# Patient Record
Sex: Female | Born: 1940 | Race: White | Hispanic: No | Marital: Single | Smoking: Never smoker
Health system: Southern US, Community
[De-identification: ages and names within clinical notes are randomized; demographics above are authoritative.]

## PROBLEM LIST (undated history)

## (undated) DIAGNOSIS — K219 Gastro-esophageal reflux disease without esophagitis: Secondary | ICD-10-CM

## (undated) DIAGNOSIS — M1990 Unspecified osteoarthritis, unspecified site: Secondary | ICD-10-CM

## (undated) DIAGNOSIS — R0602 Shortness of breath: Secondary | ICD-10-CM

## (undated) DIAGNOSIS — I10 Essential (primary) hypertension: Secondary | ICD-10-CM

## (undated) DIAGNOSIS — C50919 Malignant neoplasm of unspecified site of unspecified female breast: Secondary | ICD-10-CM

## (undated) HISTORY — PX: ABDOMINAL HYSTERECTOMY: SHX81

## (undated) HISTORY — PX: BREAST SURGERY: SHX581

## (undated) HISTORY — DX: Unspecified osteoarthritis, unspecified site: M19.90

## (undated) HISTORY — DX: Malignant neoplasm of unspecified site of unspecified female breast: C50.919

## (undated) HISTORY — PX: MOLE REMOVAL: SHX2046

## (undated) HISTORY — DX: Shortness of breath: R06.02

## (undated) HISTORY — DX: Essential (primary) hypertension: I10

## (undated) HISTORY — DX: Gastro-esophageal reflux disease without esophagitis: K21.9

---

## 1997-12-20 ENCOUNTER — Emergency Department (HOSPITAL_COMMUNITY): Admission: EM | Admit: 1997-12-20 | Discharge: 1997-12-20 | Payer: Self-pay | Admitting: *Deleted

## 1999-02-14 ENCOUNTER — Encounter: Admission: RE | Admit: 1999-02-14 | Discharge: 1999-05-15 | Payer: Self-pay | Admitting: Orthopedic Surgery

## 2002-08-06 HISTORY — PX: BREAST LUMPECTOMY: SHX2

## 2002-10-05 ENCOUNTER — Encounter (INDEPENDENT_AMBULATORY_CARE_PROVIDER_SITE_OTHER): Payer: Self-pay | Admitting: Specialist

## 2002-10-05 ENCOUNTER — Other Ambulatory Visit: Admission: RE | Admit: 2002-10-05 | Discharge: 2002-10-05 | Payer: Self-pay | Admitting: Radiology

## 2002-10-05 ENCOUNTER — Encounter: Payer: Self-pay | Admitting: Surgery

## 2002-10-05 ENCOUNTER — Encounter: Admission: RE | Admit: 2002-10-05 | Discharge: 2002-10-05 | Payer: Self-pay | Admitting: Surgery

## 2002-10-05 DIAGNOSIS — C50919 Malignant neoplasm of unspecified site of unspecified female breast: Secondary | ICD-10-CM

## 2002-10-05 HISTORY — DX: Malignant neoplasm of unspecified site of unspecified female breast: C50.919

## 2002-10-12 ENCOUNTER — Encounter: Admission: RE | Admit: 2002-10-12 | Discharge: 2002-10-12 | Payer: Self-pay | Admitting: Surgery

## 2002-10-12 ENCOUNTER — Encounter: Payer: Self-pay | Admitting: Surgery

## 2002-10-13 ENCOUNTER — Encounter: Payer: Self-pay | Admitting: Surgery

## 2002-10-13 ENCOUNTER — Ambulatory Visit (HOSPITAL_COMMUNITY): Admission: RE | Admit: 2002-10-13 | Discharge: 2002-10-13 | Payer: Self-pay | Admitting: Surgery

## 2002-10-14 ENCOUNTER — Encounter: Payer: Self-pay | Admitting: Surgery

## 2002-10-15 ENCOUNTER — Encounter: Admission: RE | Admit: 2002-10-15 | Discharge: 2002-10-15 | Payer: Self-pay | Admitting: Surgery

## 2002-10-15 ENCOUNTER — Encounter: Payer: Self-pay | Admitting: Surgery

## 2002-10-15 ENCOUNTER — Encounter (INDEPENDENT_AMBULATORY_CARE_PROVIDER_SITE_OTHER): Payer: Self-pay | Admitting: Specialist

## 2002-10-15 ENCOUNTER — Ambulatory Visit (HOSPITAL_BASED_OUTPATIENT_CLINIC_OR_DEPARTMENT_OTHER): Admission: RE | Admit: 2002-10-15 | Discharge: 2002-10-15 | Payer: Self-pay | Admitting: Surgery

## 2002-10-26 ENCOUNTER — Ambulatory Visit: Admission: RE | Admit: 2002-10-26 | Discharge: 2002-11-20 | Payer: Self-pay | Admitting: *Deleted

## 2002-11-09 ENCOUNTER — Encounter: Payer: Self-pay | Admitting: Oncology

## 2002-11-09 ENCOUNTER — Ambulatory Visit (HOSPITAL_COMMUNITY): Admission: RE | Admit: 2002-11-09 | Discharge: 2002-11-09 | Payer: Self-pay | Admitting: Oncology

## 2002-11-12 ENCOUNTER — Encounter: Payer: Self-pay | Admitting: Oncology

## 2002-11-12 ENCOUNTER — Ambulatory Visit (HOSPITAL_COMMUNITY): Admission: RE | Admit: 2002-11-12 | Discharge: 2002-11-12 | Payer: Self-pay | Admitting: Oncology

## 2002-11-16 ENCOUNTER — Ambulatory Visit (HOSPITAL_COMMUNITY): Admission: RE | Admit: 2002-11-16 | Discharge: 2002-11-16 | Payer: Self-pay | Admitting: Oncology

## 2002-11-16 ENCOUNTER — Encounter: Payer: Self-pay | Admitting: Oncology

## 2003-02-09 ENCOUNTER — Ambulatory Visit: Admission: RE | Admit: 2003-02-09 | Discharge: 2003-04-14 | Payer: Self-pay | Admitting: *Deleted

## 2003-06-02 ENCOUNTER — Ambulatory Visit (HOSPITAL_COMMUNITY): Admission: RE | Admit: 2003-06-02 | Discharge: 2003-06-02 | Payer: Self-pay | Admitting: Oncology

## 2004-08-18 ENCOUNTER — Ambulatory Visit: Payer: Self-pay | Admitting: Oncology

## 2004-12-06 ENCOUNTER — Ambulatory Visit: Payer: Self-pay | Admitting: Oncology

## 2005-01-12 ENCOUNTER — Ambulatory Visit (HOSPITAL_COMMUNITY): Admission: RE | Admit: 2005-01-12 | Discharge: 2005-01-12 | Payer: Self-pay | Admitting: Oncology

## 2005-05-31 ENCOUNTER — Ambulatory Visit: Payer: Self-pay | Admitting: Oncology

## 2005-06-01 ENCOUNTER — Ambulatory Visit (HOSPITAL_COMMUNITY): Admission: RE | Admit: 2005-06-01 | Discharge: 2005-06-01 | Payer: Self-pay | Admitting: Oncology

## 2005-10-29 ENCOUNTER — Ambulatory Visit: Payer: Self-pay | Admitting: Oncology

## 2006-01-18 ENCOUNTER — Encounter: Admission: RE | Admit: 2006-01-18 | Discharge: 2006-01-18 | Payer: Self-pay | Admitting: Surgery

## 2006-04-25 ENCOUNTER — Ambulatory Visit: Payer: Self-pay | Admitting: Oncology

## 2006-04-29 LAB — CBC WITH DIFFERENTIAL/PLATELET
Eosinophils Absolute: 0.1 10*3/uL (ref 0.0–0.5)
HCT: 39.5 % (ref 34.8–46.6)
LYMPH%: 35.7 % (ref 14.0–48.0)
MONO#: 0.4 10*3/uL (ref 0.1–0.9)
NEUT#: 3 10*3/uL (ref 1.5–6.5)
NEUT%: 55.6 % (ref 39.6–76.8)
Platelets: 223 10*3/uL (ref 145–400)
RBC: 4.31 10*6/uL (ref 3.70–5.32)
WBC: 5.4 10*3/uL (ref 3.9–10.0)
lymph#: 1.9 10*3/uL (ref 0.9–3.3)

## 2006-04-29 LAB — COMPREHENSIVE METABOLIC PANEL
ALT: 13 U/L (ref 0–40)
Alkaline Phosphatase: 58 U/L (ref 39–117)
Glucose, Bld: 152 mg/dL — ABNORMAL HIGH (ref 70–99)
Sodium: 139 mEq/L (ref 135–145)
Total Bilirubin: 0.6 mg/dL (ref 0.3–1.2)
Total Protein: 6.4 g/dL (ref 6.0–8.3)

## 2006-04-29 LAB — CANCER ANTIGEN 27.29: CA 27.29: 7 U/mL (ref 0–39)

## 2006-10-23 ENCOUNTER — Ambulatory Visit: Payer: Self-pay | Admitting: Oncology

## 2006-10-28 LAB — CBC WITH DIFFERENTIAL/PLATELET
BASO%: 0.4 % (ref 0.0–2.0)
Basophils Absolute: 0 10*3/uL (ref 0.0–0.1)
Eosinophils Absolute: 0.1 10*3/uL (ref 0.0–0.5)
HCT: 41 % (ref 34.8–46.6)
HGB: 14.6 g/dL (ref 11.6–15.9)
LYMPH%: 32.4 % (ref 14.0–48.0)
MCHC: 35.6 g/dL (ref 32.0–36.0)
MONO#: 0.4 10*3/uL (ref 0.1–0.9)
NEUT%: 57.5 % (ref 39.6–76.8)
Platelets: 228 10*3/uL (ref 145–400)
WBC: 5.8 10*3/uL (ref 3.9–10.0)

## 2006-10-28 LAB — COMPREHENSIVE METABOLIC PANEL
BUN: 13 mg/dL (ref 6–23)
CO2: 30 mEq/L (ref 19–32)
Calcium: 9.4 mg/dL (ref 8.4–10.5)
Creatinine, Ser: 0.9 mg/dL (ref 0.40–1.20)
Glucose, Bld: 115 mg/dL — ABNORMAL HIGH (ref 70–99)
Total Bilirubin: 0.7 mg/dL (ref 0.3–1.2)

## 2006-10-28 LAB — LACTATE DEHYDROGENASE: LDH: 180 U/L (ref 94–250)

## 2006-10-28 LAB — CANCER ANTIGEN 27.29: CA 27.29: 11 U/mL (ref 0–39)

## 2006-12-06 ENCOUNTER — Ambulatory Visit (HOSPITAL_COMMUNITY): Admission: RE | Admit: 2006-12-06 | Discharge: 2006-12-06 | Payer: Self-pay | Admitting: Oncology

## 2007-05-21 ENCOUNTER — Ambulatory Visit: Payer: Self-pay | Admitting: Oncology

## 2007-05-23 LAB — LACTATE DEHYDROGENASE: LDH: 189 U/L (ref 94–250)

## 2007-05-23 LAB — CBC WITH DIFFERENTIAL/PLATELET
Basophils Absolute: 0 10*3/uL (ref 0.0–0.1)
EOS%: 1.5 % (ref 0.0–7.0)
Eosinophils Absolute: 0.1 10*3/uL (ref 0.0–0.5)
LYMPH%: 30.6 % (ref 14.0–48.0)
MCH: 32 pg (ref 26.0–34.0)
MCV: 91 fL (ref 81.0–101.0)
MONO%: 7.3 % (ref 0.0–13.0)
NEUT#: 4.5 10*3/uL (ref 1.5–6.5)
Platelets: 236 10*3/uL (ref 145–400)
RBC: 4.4 10*6/uL (ref 3.70–5.32)
RDW: 13.6 % (ref 11.3–14.5)

## 2007-05-23 LAB — COMPREHENSIVE METABOLIC PANEL
AST: 18 U/L (ref 0–37)
Alkaline Phosphatase: 63 U/L (ref 39–117)
BUN: 14 mg/dL (ref 6–23)
Glucose, Bld: 123 mg/dL — ABNORMAL HIGH (ref 70–99)
Sodium: 143 mEq/L (ref 135–145)
Total Bilirubin: 0.5 mg/dL (ref 0.3–1.2)

## 2007-06-06 ENCOUNTER — Ambulatory Visit (HOSPITAL_COMMUNITY): Admission: RE | Admit: 2007-06-06 | Discharge: 2007-06-06 | Payer: Self-pay | Admitting: Oncology

## 2007-08-07 HISTORY — PX: BREAST LUMPECTOMY: SHX2

## 2007-11-19 ENCOUNTER — Ambulatory Visit: Payer: Self-pay | Admitting: Oncology

## 2007-11-21 LAB — CBC WITH DIFFERENTIAL/PLATELET
BASO%: 0.4 % (ref 0.0–2.0)
LYMPH%: 28.3 % (ref 14.0–48.0)
MCHC: 34.6 g/dL (ref 32.0–36.0)
MONO#: 0.5 10*3/uL (ref 0.1–0.9)
Platelets: 216 10*3/uL (ref 145–400)
RBC: 4.66 10*6/uL (ref 3.70–5.32)
RDW: 13.2 % (ref 11.3–14.5)
WBC: 7.8 10*3/uL (ref 3.9–10.0)
lymph#: 2.2 10*3/uL (ref 0.9–3.3)

## 2007-11-24 LAB — COMPREHENSIVE METABOLIC PANEL
ALT: 19 U/L (ref 0–35)
CO2: 28 mEq/L (ref 19–32)
Sodium: 142 mEq/L (ref 135–145)
Total Bilirubin: 0.5 mg/dL (ref 0.3–1.2)
Total Protein: 7.2 g/dL (ref 6.0–8.3)

## 2007-11-24 LAB — LACTATE DEHYDROGENASE: LDH: 211 U/L (ref 94–250)

## 2007-11-28 LAB — VITAMIN D 1,25 DIHYDROXY: Vit D, 1,25-Dihydroxy: 46 pg/mL (ref 15–75)

## 2008-04-28 ENCOUNTER — Ambulatory Visit: Payer: Self-pay | Admitting: Oncology

## 2008-04-30 LAB — CBC WITH DIFFERENTIAL/PLATELET
Basophils Absolute: 0.1 10*3/uL (ref 0.0–0.1)
Eosinophils Absolute: 0.1 10*3/uL (ref 0.0–0.5)
HGB: 14.6 g/dL (ref 11.6–15.9)
MCV: 88.9 fL (ref 81.0–101.0)
MONO#: 0.5 10*3/uL (ref 0.1–0.9)
NEUT#: 3.8 10*3/uL (ref 1.5–6.5)
RDW: 12.5 % (ref 11.3–14.5)
WBC: 6.5 10*3/uL (ref 3.9–10.0)
lymph#: 2.1 10*3/uL (ref 0.9–3.3)

## 2008-05-03 LAB — COMPREHENSIVE METABOLIC PANEL
Albumin: 4.2 g/dL (ref 3.5–5.2)
BUN: 14 mg/dL (ref 6–23)
CO2: 26 mEq/L (ref 19–32)
Calcium: 9 mg/dL (ref 8.4–10.5)
Chloride: 103 mEq/L (ref 96–112)
Glucose, Bld: 102 mg/dL — ABNORMAL HIGH (ref 70–99)
Potassium: 3.7 mEq/L (ref 3.5–5.3)
Total Protein: 7 g/dL (ref 6.0–8.3)

## 2008-05-03 LAB — LIPID PANEL
HDL: 53 mg/dL (ref >39)
LDL Cholesterol: 115 mg/dL — ABNORMAL HIGH (ref 0–99)
Total CHOL/HDL Ratio: 3.9 Ratio
VLDL: 39 mg/dL (ref 0–40)

## 2008-07-15 ENCOUNTER — Encounter (INDEPENDENT_AMBULATORY_CARE_PROVIDER_SITE_OTHER): Payer: Self-pay | Admitting: Surgery

## 2008-07-15 ENCOUNTER — Ambulatory Visit (HOSPITAL_COMMUNITY): Admission: RE | Admit: 2008-07-15 | Discharge: 2008-07-15 | Payer: Self-pay | Admitting: Surgery

## 2008-07-27 ENCOUNTER — Ambulatory Visit: Payer: Self-pay | Admitting: Oncology

## 2009-01-05 ENCOUNTER — Ambulatory Visit: Payer: Self-pay | Admitting: Oncology

## 2009-01-07 LAB — CBC WITH DIFFERENTIAL/PLATELET
BASO%: 0.3 % (ref 0.0–2.0)
Basophils Absolute: 0 10*3/uL (ref 0.0–0.1)
EOS%: 1.9 % (ref 0.0–7.0)
HGB: 14.3 g/dL (ref 11.6–15.9)
MCH: 30.9 pg (ref 25.1–34.0)
MCV: 90.2 fL (ref 79.5–101.0)
MONO%: 7.8 % (ref 0.0–14.0)
RBC: 4.62 10*6/uL (ref 3.70–5.45)
RDW: 13 % (ref 11.2–14.5)
lymph#: 2 10*3/uL (ref 0.9–3.3)

## 2009-01-10 LAB — COMPREHENSIVE METABOLIC PANEL
ALT: 16 U/L (ref 0–35)
AST: 19 U/L (ref 0–37)
Albumin: 3.9 g/dL (ref 3.5–5.2)
Alkaline Phosphatase: 52 U/L (ref 39–117)
BUN: 8 mg/dL (ref 6–23)
Calcium: 8.6 mg/dL (ref 8.4–10.5)
Chloride: 105 mEq/L (ref 96–112)
Potassium: 4 mEq/L (ref 3.5–5.3)
Sodium: 144 mEq/L (ref 135–145)
Total Protein: 6.6 g/dL (ref 6.0–8.3)

## 2009-01-10 LAB — LIPID PANEL
LDL Cholesterol: 129 mg/dL — ABNORMAL HIGH (ref 0–99)
Triglycerides: 146 mg/dL (ref <150)

## 2009-02-04 ENCOUNTER — Ambulatory Visit (HOSPITAL_COMMUNITY): Admission: RE | Admit: 2009-02-04 | Discharge: 2009-02-04 | Payer: Self-pay | Admitting: Oncology

## 2009-08-02 ENCOUNTER — Ambulatory Visit: Payer: Self-pay | Admitting: Oncology

## 2009-08-04 LAB — CBC WITH DIFFERENTIAL/PLATELET
Basophils Absolute: 0 10*3/uL (ref 0.0–0.1)
EOS%: 2.3 % (ref 0.0–7.0)
Eosinophils Absolute: 0.2 10*3/uL (ref 0.0–0.5)
HGB: 15.2 g/dL (ref 11.6–15.9)
MCH: 31.6 pg (ref 25.1–34.0)
MCV: 92.4 fL (ref 79.5–101.0)
MONO%: 8.1 % (ref 0.0–14.0)
NEUT#: 4.1 10*3/uL (ref 1.5–6.5)
RBC: 4.8 10*6/uL (ref 3.70–5.45)
RDW: 13.8 % (ref 11.2–14.5)
lymph#: 1.8 10*3/uL (ref 0.9–3.3)

## 2009-08-04 LAB — COMPREHENSIVE METABOLIC PANEL
AST: 35 U/L (ref 0–37)
Albumin: 4 g/dL (ref 3.5–5.2)
Alkaline Phosphatase: 64 U/L (ref 39–117)
BUN: 10 mg/dL (ref 6–23)
Calcium: 9.3 mg/dL (ref 8.4–10.5)
Chloride: 104 mEq/L (ref 96–112)
Potassium: 3.9 mEq/L (ref 3.5–5.3)
Sodium: 143 mEq/L (ref 135–145)
Total Protein: 6.7 g/dL (ref 6.0–8.3)

## 2009-08-04 LAB — CANCER ANTIGEN 27.29: CA 27.29: 10 U/mL (ref 0–39)

## 2009-08-04 LAB — VITAMIN D 25 HYDROXY (VIT D DEFICIENCY, FRACTURES): Vit D, 25-Hydroxy: 42 ng/mL (ref 30–89)

## 2010-02-01 ENCOUNTER — Ambulatory Visit: Payer: Self-pay | Admitting: Oncology

## 2010-02-03 LAB — CBC WITH DIFFERENTIAL/PLATELET
BASO%: 0.5 % (ref 0.0–2.0)
Basophils Absolute: 0 10*3/uL (ref 0.0–0.1)
EOS%: 2.2 % (ref 0.0–7.0)
Eosinophils Absolute: 0.1 10*3/uL (ref 0.0–0.5)
HCT: 43.1 % (ref 34.8–46.6)
HGB: 14.8 g/dL (ref 11.6–15.9)
LYMPH%: 34 % (ref 14.0–49.7)
MCH: 31.2 pg (ref 25.1–34.0)
MCHC: 34.3 g/dL (ref 31.5–36.0)
MCV: 90.8 fL (ref 79.5–101.0)
MONO#: 0.4 10*3/uL (ref 0.1–0.9)
MONO%: 7.6 % (ref 0.0–14.0)
NEUT#: 3.3 10*3/uL (ref 1.5–6.5)
NEUT%: 55.7 % (ref 38.4–76.8)
Platelets: 176 10*3/uL (ref 145–400)
RBC: 4.75 10*6/uL (ref 3.70–5.45)
RDW: 13.4 % (ref 11.2–14.5)
WBC: 5.9 10*3/uL (ref 3.9–10.3)
lymph#: 2 10*3/uL (ref 0.9–3.3)

## 2010-02-03 LAB — LIPID PANEL
Cholesterol: 204 mg/dL — ABNORMAL HIGH (ref 0–200)
HDL: 53 mg/dL (ref >39)
LDL Cholesterol: 116 mg/dL — ABNORMAL HIGH (ref 0–99)
Total CHOL/HDL Ratio: 3.8 Ratio
Triglycerides: 175 mg/dL — ABNORMAL HIGH (ref <150)
VLDL: 35 mg/dL (ref 0–40)

## 2010-02-03 LAB — COMPREHENSIVE METABOLIC PANEL
ALT: 10 U/L (ref 0–35)
AST: 17 U/L (ref 0–37)
Albumin: 4 g/dL (ref 3.5–5.2)
Alkaline Phosphatase: 50 U/L (ref 39–117)
BUN: 13 mg/dL (ref 6–23)
CO2: 27 mEq/L (ref 19–32)
Calcium: 9.2 mg/dL (ref 8.4–10.5)
Chloride: 103 mEq/L (ref 96–112)
Creatinine, Ser: 0.93 mg/dL (ref 0.40–1.20)
Glucose, Bld: 95 mg/dL (ref 70–99)
Potassium: 3.9 mEq/L (ref 3.5–5.3)
Sodium: 141 mEq/L (ref 135–145)
Total Bilirubin: 0.8 mg/dL (ref 0.3–1.2)
Total Protein: 6.6 g/dL (ref 6.0–8.3)

## 2010-02-03 LAB — LACTATE DEHYDROGENASE: LDH: 187 U/L (ref 94–250)

## 2010-02-03 LAB — VITAMIN D 25 HYDROXY (VIT D DEFICIENCY, FRACTURES): Vit D, 25-Hydroxy: 56 ng/mL (ref 30–89)

## 2010-02-03 LAB — CANCER ANTIGEN 27.29: CA 27.29: 4 U/mL (ref 0–39)

## 2010-07-27 ENCOUNTER — Ambulatory Visit (HOSPITAL_BASED_OUTPATIENT_CLINIC_OR_DEPARTMENT_OTHER): Payer: Commercial Managed Care - PPO | Admitting: Oncology

## 2010-07-28 LAB — CBC WITH DIFFERENTIAL/PLATELET
BASO%: 0.3 % (ref 0.0–2.0)
Basophils Absolute: 0 10*3/uL (ref 0.0–0.1)
EOS%: 1.5 % (ref 0.0–7.0)
Eosinophils Absolute: 0.1 10*3/uL (ref 0.0–0.5)
HCT: 42.7 % (ref 34.8–46.6)
HGB: 14.5 g/dL (ref 11.6–15.9)
LYMPH%: 30.7 % (ref 14.0–49.7)
MCH: 30.9 pg (ref 25.1–34.0)
MCHC: 34.1 g/dL (ref 31.5–36.0)
MCV: 90.8 fL (ref 79.5–101.0)
MONO#: 0.6 10*3/uL (ref 0.1–0.9)
MONO%: 8.7 % (ref 0.0–14.0)
NEUT#: 4.3 10*3/uL (ref 1.5–6.5)
NEUT%: 58.8 % (ref 38.4–76.8)
Platelets: 205 10*3/uL (ref 145–400)
RBC: 4.7 10*6/uL (ref 3.70–5.45)
RDW: 13.3 % (ref 11.2–14.5)
WBC: 7.3 10*3/uL (ref 3.9–10.3)
lymph#: 2.2 10*3/uL (ref 0.9–3.3)

## 2010-07-29 LAB — COMPREHENSIVE METABOLIC PANEL
ALT: 19 U/L (ref 0–35)
AST: 23 U/L (ref 0–37)
Albumin: 3.9 g/dL (ref 3.5–5.2)
Alkaline Phosphatase: 55 U/L (ref 39–117)
BUN: 16 mg/dL (ref 6–23)
CO2: 30 mEq/L (ref 19–32)
Calcium: 9.2 mg/dL (ref 8.4–10.5)
Chloride: 103 mEq/L (ref 96–112)
Creatinine, Ser: 0.88 mg/dL (ref 0.40–1.20)
Glucose, Bld: 85 mg/dL (ref 70–99)
Potassium: 4.1 mEq/L (ref 3.5–5.3)
Sodium: 140 mEq/L (ref 135–145)
Total Bilirubin: 0.4 mg/dL (ref 0.3–1.2)
Total Protein: 6.3 g/dL (ref 6.0–8.3)

## 2010-07-29 LAB — VITAMIN D 25 HYDROXY (VIT D DEFICIENCY, FRACTURES): Vit D, 25-Hydroxy: 46 ng/mL (ref 30–89)

## 2010-07-29 LAB — LACTATE DEHYDROGENASE: LDH: 170 U/L (ref 94–250)

## 2010-07-29 LAB — CANCER ANTIGEN 27.29: CA 27.29: 12 U/mL (ref 0–39)

## 2010-08-10 ENCOUNTER — Encounter (INDEPENDENT_AMBULATORY_CARE_PROVIDER_SITE_OTHER): Payer: Self-pay | Admitting: *Deleted

## 2010-08-21 DIAGNOSIS — C50219 Malignant neoplasm of upper-inner quadrant of unspecified female breast: Secondary | ICD-10-CM

## 2010-08-21 DIAGNOSIS — Z170 Estrogen receptor positive status [ER+]: Secondary | ICD-10-CM

## 2010-09-07 NOTE — Letter (Signed)
Summary: Colonoscopy Date Change Letter  [facility]  [address] [place], [place] [address]   Phone: [phone]  Fax: [phone]      August 10, 2010 MRN: [id number]   [facility] [address] [place], [place]  [address]   Dear Ms. Marcel,   Previously you were recommended to have a repeat colonoscopy around this time. Your chart was recently reviewed by Dr. Lina Sar of [facility]. Follow up colonoscopy is now recommended in December 2014. This revised recommendation is based on current, nationally recognized guidelines for colorectal cancer screening and polyp surveillance. These guidelines are endorsed by the American Cancer Society, [employer] on Colorectal Cancer as well as numerous other major medical organizations.  Please understand that our recommendation assumes that you do not have any new symptoms such as bleeding, a change in bowel habits, anemia, or significant abdominal discomfort. If you do have any concerning GI symptoms or want to discuss the guideline recommendations, please call to arrange an office visit at your earliest convenience. Otherwise we will keep you in our reminder system and contact you 1-2 months prior to the date listed above to schedule your next colonoscopy.  Thank you,  Hedwig Morton. Juanda Chance, M.D.  [facility] [phone]

## 2010-12-19 NOTE — Op Note (Signed)
NAME:  Sara Wells, Sara Wells                 ACCOUNT NO.:  [id number]   MEDICAL RECORD NO.:  [id number]          PATIENT TYPE:  AMB   LOCATION:  SDS                          FACILITY:  [facility]   PHYSICIAN:  Currie Paris, M.D.DATE OF BIRTH:  1941/01/11   DATE OF PROCEDURE:  07/15/2008  DATE OF DISCHARGE:  07/15/2008                               OPERATIVE REPORT   PREOPERATIVE DIAGNOSIS:  Palpable left breast mass.   POSTOPERATIVE DIAGNOSIS:  Palpable left breast mass.   OPERATION:  Excisional biopsy, left breast mass.   SURGEON:  Currie Paris, MD   ANESTHESIA:  General.   CLINICAL HISTORY:  This is a 70 year old lady status post right  lumpectomy in 2004 for carcinoma who has a palpable abnormality in the  left breast, medially located that was not visualized on imaging  studies.  It was followed for 3 months and at this point because of its  persistence I elected to biopsy this surgically.  It was felt this was  most likely fibrocystic and fibrotic changes mixed in with some fatty  tissue and benign.   DESCRIPTION OF PROCEDURE:  The patient was seen in the holding area and  she had no further questions.  We reviewed the plans for the procedure  as noted above and I initialed the left breast as the operative side.   The patient was taken into the operating room and prior to being given  any sedation, the exact location of the mass was identified and marked  by the patient and myself.  It was about the 9 o'clock position of the  left breast.   After satisfactory general anesthesia had been obtained, the breast was  prepped and draped and the time-out was done.   I then injected 0.25% plain Marcaine all around the area of the biopsy.  I made a transverse incision and divided the subcutaneous tissue and  took a wide excisional biopsy of the tissue underneath which appeared to  be fatty tissue with fibrotic breast tissue mixed in consistent with the  preoperative clots.   Once I had this widely out, I had to make sure that  was dry using either cautery or suture ligatures of 3-0 Vicryl.  I then  closed with 3-0 Vicryl, 4-0 Monocryl subcuticular, and Dermabond.   The patient tolerated the procedure well and there were no  complications.  All counts were correct.      Currie Paris, M.D.  Electronically Signed    CJS/MEDQ  D:  07/15/2008  T:  07/15/2008  Job:  [id number]

## 2011-01-09 ENCOUNTER — Encounter (INDEPENDENT_AMBULATORY_CARE_PROVIDER_SITE_OTHER): Payer: Self-pay | Admitting: Surgery

## 2011-02-09 ENCOUNTER — Encounter: Payer: Commercial Managed Care - PPO | Admitting: Oncology

## 2011-02-09 ENCOUNTER — Other Ambulatory Visit: Payer: Self-pay | Admitting: Oncology

## 2011-02-09 LAB — LIPID PANEL
LDL Cholesterol: 126 mg/dL — ABNORMAL HIGH (ref 0–99)
Total CHOL/HDL Ratio: 3.7 Ratio
VLDL: 22 mg/dL (ref 0–40)

## 2011-03-02 ENCOUNTER — Ambulatory Visit (INDEPENDENT_AMBULATORY_CARE_PROVIDER_SITE_OTHER): Payer: Commercial Managed Care - PPO | Admitting: Surgery

## 2011-03-02 ENCOUNTER — Encounter (INDEPENDENT_AMBULATORY_CARE_PROVIDER_SITE_OTHER): Payer: Self-pay | Admitting: Surgery

## 2011-03-02 VITALS — BP 130/82 | HR 76 | Temp 96.6°F | Ht 64.0 in

## 2011-03-02 DIAGNOSIS — Z853 Personal history of malignant neoplasm of breast: Secondary | ICD-10-CM

## 2011-03-02 NOTE — Progress Notes (Signed)
03/02/2011  Sara Wells is a 70 y.o.female who presents for routine followup of her Stage II IDC, Receptor positivediagnosed in 2004 and treated with Lumpectomy,SLN and Radiation. She has no problems or concerns on either side.  PFSH She has had no significant changes since the last visit here.  ROS There have been no significant changes since the last visit here  General: The patient is alert, oriented, generally healty appearing, NAD. Mood and affect are normal.  Breasts: The right breast remains a little tender at the lumpectomy site. However there are no abnormal areas. The remainder of the breast is normal. The left breast is completely normal to both inspection and palpation. Lymphatics: She has no axillary or supraclavicular adenopathy on either side.  Extremities: Full ROM of the surgical side with no lymphedema noted.  Data Reviewed: Last mammogram was WNL and she is due next month  Impression: Normal exam, no evidence of recurrence  Plan: Will see back on a PRN basis

## 2011-03-02 NOTE — Patient Instructions (Signed)
I will see you back on an as-needed basis. Call if you have any problems or questions or any issues with your breast.

## 2011-05-10 LAB — BASIC METABOLIC PANEL
CO2: 29 mEq/L (ref 19–32)
Chloride: 104 mEq/L (ref 96–112)
GFR calc Af Amer: >60 mL/min (ref >60)
Potassium: 3.8 mEq/L (ref 3.5–5.1)

## 2011-05-10 LAB — CBC
Platelets: 215 10*3/uL (ref 150–400)
WBC: 8.2 10*3/uL (ref 4.0–10.5)

## 2011-06-12 ENCOUNTER — Encounter: Payer: Self-pay | Admitting: *Deleted

## 2011-06-12 ENCOUNTER — Other Ambulatory Visit: Payer: Self-pay | Admitting: *Deleted

## 2011-08-01 ENCOUNTER — Telehealth: Payer: Self-pay | Admitting: *Deleted

## 2011-08-01 NOTE — Telephone Encounter (Signed)
patient confirmed over the phone the new date and time on 09-03-2011 at 4:00pm lab still on the same date and time

## 2011-08-21 ENCOUNTER — Telehealth: Payer: Self-pay | Admitting: *Deleted

## 2011-08-21 NOTE — Telephone Encounter (Signed)
patient confirmed over the phone the new date and time of the doctor's appointment

## 2011-08-22 ENCOUNTER — Telehealth: Payer: Self-pay | Admitting: *Deleted

## 2011-08-22 NOTE — Telephone Encounter (Signed)
Received phone call from patient stating that she has finished one bottle of pills but still has additional pills in her second bottle that she can take for now. Patient is expected to have enough medication to last until mid-February. Patient will have lab appointment this Friday, with medication exchange at provider visit, which has been rescheduled from 08/31/11 with Dr. Donnie Coffin to 09/04/11 with Debbora Presto PA. Patient states she does have enough medication to last until that time, but will contact research nurse if needed in the interim.

## 2011-08-24 ENCOUNTER — Other Ambulatory Visit (HOSPITAL_BASED_OUTPATIENT_CLINIC_OR_DEPARTMENT_OTHER): Payer: Medicare Other | Admitting: Lab

## 2011-08-24 DIAGNOSIS — Z170 Estrogen receptor positive status [ER+]: Secondary | ICD-10-CM

## 2011-08-24 DIAGNOSIS — C50219 Malignant neoplasm of upper-inner quadrant of unspecified female breast: Secondary | ICD-10-CM

## 2011-08-24 LAB — CBC WITH DIFFERENTIAL/PLATELET
Basophils Absolute: 0 10*3/uL (ref 0.0–0.1)
HCT: 44.7 % (ref 34.8–46.6)
HGB: 15.1 g/dL (ref 11.6–15.9)
MONO#: 0.5 10*3/uL (ref 0.1–0.9)
NEUT#: 3.8 10*3/uL (ref 1.5–6.5)
NEUT%: 59.2 % (ref 38.4–76.8)
RDW: 13.3 % (ref 11.2–14.5)
WBC: 6.4 10*3/uL (ref 3.9–10.3)
lymph#: 1.9 10*3/uL (ref 0.9–3.3)

## 2011-08-25 LAB — COMPREHENSIVE METABOLIC PANEL
ALT: 21 U/L (ref 0–35)
Albumin: 4.1 g/dL (ref 3.5–5.2)
BUN: 13 mg/dL (ref 6–23)
CO2: 26 mEq/L (ref 19–32)
Calcium: 9.2 mg/dL (ref 8.4–10.5)
Chloride: 103 mEq/L (ref 96–112)
Creatinine, Ser: 1.17 mg/dL — ABNORMAL HIGH (ref 0.50–1.10)
Potassium: 3.8 mEq/L (ref 3.5–5.3)

## 2011-08-25 LAB — VITAMIN D 25 HYDROXY (VIT D DEFICIENCY, FRACTURES): Vit D, 25-Hydroxy: 45 ng/mL (ref 30–89)

## 2011-08-31 ENCOUNTER — Ambulatory Visit: Payer: Commercial Managed Care - PPO | Admitting: Oncology

## 2011-09-03 ENCOUNTER — Ambulatory Visit: Payer: Commercial Managed Care - PPO | Admitting: Oncology

## 2011-09-04 ENCOUNTER — Telehealth: Payer: Self-pay | Admitting: Oncology

## 2011-09-04 ENCOUNTER — Encounter: Payer: Self-pay | Admitting: *Deleted

## 2011-09-04 ENCOUNTER — Ambulatory Visit (HOSPITAL_BASED_OUTPATIENT_CLINIC_OR_DEPARTMENT_OTHER): Payer: Medicare Other | Admitting: Physician Assistant

## 2011-09-04 ENCOUNTER — Encounter: Payer: Self-pay | Admitting: Physician Assistant

## 2011-09-04 VITALS — BP 148/85 | HR 79 | Temp 98.2°F | Ht 64.0 in | Wt 193.7 lb

## 2011-09-04 DIAGNOSIS — C50919 Malignant neoplasm of unspecified site of unspecified female breast: Secondary | ICD-10-CM

## 2011-09-04 MED ORDER — LETROZOLE/PLACEBO 2.5MG TABS #110 NSABP B-42: 2.5000 mg | ORAL_TABLET | Freq: Every day | ORAL | Status: DC

## 2011-09-04 NOTE — Progress Notes (Signed)
Hematology and Oncology Follow Up Visit  Sara Wells [id number] [birth year] 71 y.o. 09/04/2011    HPI:   Sara Wells is a 71 year old [place] woman with a history of a T2, N1, ER/PR positive right breast carcinoma for which she underwent a right lumpectomy with sentinel node dissection in 2004. She received 4 cycles of Adriamycin/Cytoxan completed August 2004. She then received radiation therapy to the remaining right breast tissue which was completed December 2004. She completed 5 years of tamoxifen in September of 2009 and is currently being followed on the NSABP B-42 study taking letrozole/placebo on a daily basis since.  Interim History:     Sara Wells is doing well. She denies any unexplained fevers chills night sweats shortness of breath or chest pain. No nausea emesis diarrhea or constipation issues. She denies any unusual diffuse bony pain, she does have occasional right sciatica issues for which he does see a local chiropractor with excellent results. She denies any joint sensitivity or stiffness. No bleeding or bruising symptoms. She will be due for her annual mammogram at [facility] in August of 2013. Biannual bone density will be scheduled for February 2013.  She denies any diffuse breast tenderness, but this note a few weeks ago she experienced sudden onset of pain in the upper outer aspect of her right breast which radiated to the nipple. She takes a Tylenol and eventually abated it lasted only about a day total, and has not recurred since. She was unaware of any him nipple discharge or bruising of this area no right arm lymphedema.  A detailed review of systems is otherwise noncontributory as noted below.  Review of Systems: Constitutional:  no weight loss, fever, night sweats and feels well Eyes: No complaints ENT:No complaints Cardiovascular: no chest pain or dyspnea on exertion Respiratory: no cough, shortness of breath, or wheezing Neurological: no TIA or stroke  symptoms Dermatological: negative Gastrointestinal: no abdominal pain, change in bowel habits, or black or bloody stools Genito-Urinary: no dysuria, trouble voiding, or hematuria Hematological and Lymphatic: negative Breast: negative for breast lumps Musculoskeletal: Occasional R sided sciatica Remaining ROS negative.  ECOG : 0  Medications:   I have reviewed the patient's current medications.  Current Outpatient Prescriptions  Medication Sig Dispense Refill  . CALCIUM PO Take by mouth.        . Cholecalciferol (VITAMIN D PO) Take by mouth.        . Investigational letrozole/placebo 2.5MG  tablet NSABP B-42 Take 2.5 mg by mouth daily.        . Multiple Vitamin (MULTIVITAMIN PO) Take by mouth.        Marland Kitchen VITAMIN E PO Take by mouth.          Allergies:  Allergies  Allergen Reactions  . Codeine Rash    Physical Exam: Filed Vitals:   09/04/11 1422  BP: 148/85  Pulse: 79  Temp: 98.2 F (36.8 C)   HEENT:  Sclerae anicteric, conjunctivae pink.  Oropharynx clear.  No mucositis or candidiasis.   Nodes:  No cervical, supraclavicular, or axillary lymphadenopathy palpated.  Breast Exam: Right breast lumpectomy scar is free of any palpable masses skin changes. There is no nipple inversion or discharge. No axillary fullness or lymphadenopathy. The left breast is free of masses skin changes nipple inversion or discharge axilla clear.  Lungs:  Clear to auscultation bilaterally.  No crackles, rhonchi, or wheezes.   Heart:  Regular rate and rhythm.   Abdomen:  Soft, nontender.  Positive bowel sounds.  No organomegaly or masses palpated.   Musculoskeletal:  No focal spinal tenderness to palpation.  Extremities:  Benign.  No peripheral edema or cyanosis.   Skin:  Benign.   Neuro:  Nonfocal, alert and oriented x 3.   Lab Results: Lab Results  Component Value Date   WBC 6.4 08/24/2011   HGB 15.1 08/24/2011   HCT 44.7 08/24/2011   MCV 90.2 08/24/2011   PLT 195 08/24/2011   NEUTROABS 3.8  08/24/2011     Chemistry      Component Value Date/Time   NA 141 08/24/2011 1307   K 3.8 08/24/2011 1307   CL 103 08/24/2011 1307   CO2 26 08/24/2011 1307   BUN 13 08/24/2011 1307   CREATININE 1.17* 08/24/2011 1307      Component Value Date/Time   CALCIUM 9.2 08/24/2011 1307   ALKPHOS 57 08/24/2011 1307   AST 24 08/24/2011 1307   ALT 21 08/24/2011 1307   BILITOT 0.4 08/24/2011 1307        Assessment:     A 71 year old [place] woman with a history of a T2, N1, ER/PR positive right breast carcinoma for which he underwent a right lumpectomy with sentinel node dissection followed by 4 cycles of adjuvant Adriamycin/Cytoxan completed August of 2004. She completed her radiation December 2004, 5 years of tamoxifen currently on letrozole/placebo according to NSABP B-42 study. She has no evidence to suggest recurrent or metastatic disease.   Case reviewed with Dr. Pierce Crane.     Plan:     Sara Wells will continue on letrozole/placebo according to NSABP B-42 study.  She will be scheduled for her biannual bone density February 2013. She will also be scheduled for her annual mammogram at [facility] August of 2013. She will return July 2013 for fasted lipid panel and assessment by Judithann Graves RN of the research protocol office. She will return to see Dr. Donnie Coffin in one year's time with appropriate pre-labs the week before to include a CBC, serum chemistry, LDH, CA 27-29, and vitamin D level. She knows to contact us prior to her year followup if the need should arise.   This plan was reviewed with the patient, who voices understanding and agreement.  She knows to call with any changes or problems.    Ronnald Shedden T, PA-C 09/04/2011

## 2011-09-04 NOTE — Telephone Encounter (Signed)
gve the pt her July,jan 2013,2014 appts along with the appt for hwer mammo/bone density appts

## 2011-09-04 NOTE — Progress Notes (Signed)
Patient in to clinic today for routine follow-up visit at Month 36 on study. Copy of patient lab results from 08/24/11, plus lipid profile results from July 2102 were given to patient today, per her request. Patient returned pill bottles #11 (6 tablets remaining) and #12 (empty) today.She notes that there are additional daily doses in her pill box at home for dosing this Wednesday through Sunday. She states that she does not believe she has missed any doses of medication. Bottles #13 and 14 were dispensed today and she will begin taking doses from that supply on Monday, February 4th.  Patient denies any new side effects other than ongoing right hip pain and some moderate breast pain that occurred about two weeks ago which resolved with use of Tylenol. She has had no blood clots, cardiac events, fractures or hospitalizations. She is taking no new medication.   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  Patient compliance: Patient took 214 tablets in the 211 day period from 02/11/2011 through 09/09/2011 (anticipated continued daily dosing with tablets in pill box, through Sunday), for a compliance rate of 101%.

## 2011-09-04 NOTE — Telephone Encounter (Signed)
Faxed the orders over to [facility] for the mammo/bone density appts

## 2011-10-01 ENCOUNTER — Telehealth: Payer: Self-pay | Admitting: Oncology

## 2011-10-01 NOTE — Telephone Encounter (Signed)
lmonvm adviisng the pt of her r/s bone density appt from aug to [facility]

## 2012-02-17 ENCOUNTER — Other Ambulatory Visit: Payer: Self-pay | Admitting: *Deleted

## 2012-02-25 ENCOUNTER — Telehealth: Payer: Self-pay | Admitting: *Deleted

## 2012-02-25 NOTE — Telephone Encounter (Signed)
Spoke with patient by phone today to remind her of appointments scheduled for Wednesday at 10am. Patient is to fast after midnight on Tuesday, with only water to drink (patient states she does not drink coffee). Patient is to return her current two study medication bottles and two new bottles will be dispensed.

## 2012-02-27 ENCOUNTER — Other Ambulatory Visit (HOSPITAL_BASED_OUTPATIENT_CLINIC_OR_DEPARTMENT_OTHER): Payer: Medicare Other | Admitting: Lab

## 2012-02-27 ENCOUNTER — Encounter: Payer: Medicare Other | Admitting: *Deleted

## 2012-02-27 DIAGNOSIS — C50919 Malignant neoplasm of unspecified site of unspecified female breast: Secondary | ICD-10-CM

## 2012-02-27 DIAGNOSIS — C50219 Malignant neoplasm of upper-inner quadrant of unspecified female breast: Secondary | ICD-10-CM

## 2012-02-27 MED ORDER — LETROZOLE/PLACEBO 2.5MG TABS #110 NSABP B-42: 2.5000 mg | ORAL_TABLET | Freq: Every day | ORAL | Status: DC

## 2012-02-27 NOTE — Progress Notes (Signed)
Patient in to clinic today for Month 42 visit. She has been fasting for lipid profile to be obtained today; patient requests a copy of the results mailed to her by research nurse, so that she can provide them to her PCP, Dr. Wylene Simmer. Patient returned bottle #13 with 27 pills remaining, and bottle #14 with 10 pills remaining. She states that she took tablets from bottle #13 from 09/10/11 to 11/17/11 and then took tablets from bottle #14 through 02/16/12. Due to few tablets in bottle #14, she went back to taking tablets from bottle #13. She has used pills from that bottle to fill her pill box through Sunday, 03/02/12. Patient states she does not believe she has missed any doses of study medication.  Bottles #15 and 16 dispensed to patient today, and patient will begin taking doses from bottle #15 on Monday, July 29th.  Patient denies any fractures, blood clots, cardiac events or hospitalizations. She is not taking any bisphosphonates or lipid therapy. Her only complaint is bone aching (moderate - grade 2) which she will discuss with her PCP at her next visit in October.

## 2012-02-28 LAB — LIPID PANEL
HDL: 51 mg/dL (ref >39)
LDL Cholesterol: 130 mg/dL — ABNORMAL HIGH (ref 0–99)
Triglycerides: 134 mg/dL (ref <150)

## 2012-07-28 ENCOUNTER — Other Ambulatory Visit: Payer: Self-pay | Admitting: *Deleted

## 2012-07-28 ENCOUNTER — Telehealth: Payer: Self-pay | Admitting: *Deleted

## 2012-07-28 NOTE — Telephone Encounter (Signed)
Spoke with patient by phone today who states that she received a voice mail message from Baltazar Apo regarding appointment changes. Patient states she left a voice mail message for Misty Stanley this morning. Explained to patient that Dr. Donnie Coffin will be leaving the Cancer Center as of 08/05/2012 and that her care is being transferred to Dr. Ruthann Cancer.  Patient notified of follow-up appointment with Dr. Darnelle Catalan scheduled for Tuesday, January 21st at 10:00am. Discussed lab appointment originally scheduled for 08/01/12, noting that it is preferred to have labs obtained prior to MD visit so results will be available. Patient states that she wishes to cancel her 12/27 lab appointment due to concerns about insurance coverage since she has already had labwork performed twice in 2013. Will reschedule lab to January 8th at 2pm per patient preference.

## 2012-07-28 NOTE — Telephone Encounter (Signed)
Pt returned my call and stated that she also spoke with Judithann Graves and was aware that Dr. Donnie Coffin would no longer be here effective 08/06/12.  Confirmed 08/26/12 appt w/ pt.  Requested pt to come in a little before 10 to fill out some paperwork for Dr. Darnelle Catalan.

## 2012-08-01 ENCOUNTER — Ambulatory Visit: Payer: Medicare Other | Admitting: Lab

## 2012-08-07 ENCOUNTER — Ambulatory Visit: Payer: Medicare Other | Admitting: Oncology

## 2012-08-09 ENCOUNTER — Encounter: Payer: Self-pay | Admitting: Oncology

## 2012-08-13 ENCOUNTER — Other Ambulatory Visit (HOSPITAL_BASED_OUTPATIENT_CLINIC_OR_DEPARTMENT_OTHER): Payer: Medicare Other | Admitting: Lab

## 2012-08-13 DIAGNOSIS — C50219 Malignant neoplasm of upper-inner quadrant of unspecified female breast: Secondary | ICD-10-CM

## 2012-08-13 DIAGNOSIS — C50919 Malignant neoplasm of unspecified site of unspecified female breast: Secondary | ICD-10-CM

## 2012-08-13 LAB — COMPREHENSIVE METABOLIC PANEL (CC13)
BUN: 14 mg/dL (ref 7.0–26.0)
CO2: 29 mEq/L (ref 22–29)
Calcium: 9.4 mg/dL (ref 8.4–10.4)
Chloride: 105 mEq/L (ref 98–107)
Creatinine: 1 mg/dL (ref 0.6–1.1)
Glucose: 88 mg/dl (ref 70–99)
Total Bilirubin: 0.53 mg/dL (ref 0.20–1.20)

## 2012-08-13 LAB — CBC WITH DIFFERENTIAL/PLATELET
BASO%: 0.4 % (ref 0.0–2.0)
Basophils Absolute: 0 10*3/uL (ref 0.0–0.1)
HCT: 44.8 % (ref 34.8–46.6)
HGB: 15.2 g/dL (ref 11.6–15.9)
LYMPH%: 28.7 % (ref 14.0–49.7)
MCHC: 33.9 g/dL (ref 31.5–36.0)
MONO#: 0.6 10*3/uL (ref 0.1–0.9)
NEUT%: 61.1 % (ref 38.4–76.8)
Platelets: 186 10*3/uL (ref 145–400)
WBC: 7.5 10*3/uL (ref 3.9–10.3)
lymph#: 2.2 10*3/uL (ref 0.9–3.3)

## 2012-08-13 LAB — LACTATE DEHYDROGENASE (CC13): LDH: 216 U/L (ref 125–245)

## 2012-08-14 LAB — VITAMIN D 25 HYDROXY (VIT D DEFICIENCY, FRACTURES): Vit D, 25-Hydroxy: 48 ng/mL (ref 30–89)

## 2012-08-14 LAB — CANCER ANTIGEN 27.29: CA 27.29: 14 U/mL (ref 0–39)

## 2012-08-22 ENCOUNTER — Telehealth: Payer: Self-pay | Admitting: *Deleted

## 2012-08-22 NOTE — Telephone Encounter (Signed)
Confirmed appt with Dr. Darnelle Catalan on 08/26/12.

## 2012-08-25 ENCOUNTER — Telehealth: Payer: Self-pay | Admitting: *Deleted

## 2012-08-25 NOTE — Telephone Encounter (Signed)
Spoke with patient by phone today to remind her to bring study medication bottles with her for exchange tomorrow. Also explained to patient that I would follow-up after my return next week, to confirm future appointments and to scheduled 16-month medication exchange visit as needed.

## 2012-08-26 ENCOUNTER — Encounter: Payer: Self-pay | Admitting: *Deleted

## 2012-08-26 ENCOUNTER — Other Ambulatory Visit: Payer: Medicare Other | Admitting: Lab

## 2012-08-26 ENCOUNTER — Ambulatory Visit (HOSPITAL_BASED_OUTPATIENT_CLINIC_OR_DEPARTMENT_OTHER): Payer: Medicare Other | Admitting: Oncology

## 2012-08-26 ENCOUNTER — Telehealth: Payer: Self-pay | Admitting: Oncology

## 2012-08-26 VITALS — BP 153/83 | HR 75 | Temp 97.3°F | Resp 20 | Ht 63.0 in | Wt 197.3 lb

## 2012-08-26 DIAGNOSIS — C50919 Malignant neoplasm of unspecified site of unspecified female breast: Secondary | ICD-10-CM

## 2012-08-26 DIAGNOSIS — C50219 Malignant neoplasm of upper-inner quadrant of unspecified female breast: Secondary | ICD-10-CM

## 2012-08-26 DIAGNOSIS — M545 Low back pain: Secondary | ICD-10-CM

## 2012-08-26 DIAGNOSIS — G8929 Other chronic pain: Secondary | ICD-10-CM

## 2012-08-26 DIAGNOSIS — Z170 Estrogen receptor positive status [ER+]: Secondary | ICD-10-CM

## 2012-08-26 MED ORDER — LETROZOLE/PLACEBO 2.5MG TABS #110 NSABP B-42: 2.5000 mg | ORAL_TABLET | Freq: Every day | ORAL | Status: DC

## 2012-08-26 NOTE — Progress Notes (Signed)
ID: [name]   DOB: Dec 04, 1940  MR#: [id number]  NFA#:[id number]  PCP: Gaspar Garbe, MD GYN:  SUCicero Duck OTHER MD: Jeralyn Ruths  HISTORY OF PRESENT ILLNESS: Routine scanning mammography on 09-07-02 did show the visible appearance of some right breast architectural distortion.  Subsequent additional views shows a suspicious abnormality in the upper inner quadrant of the right breast measuring approximately 1.5 to 2.0 cm. Ultrasound was also done on 10-05-02 and confirmed the presence of a hypoechoic mass measuring about 4-5 cm in about the 1 o'clock position 8 cm from the right nipple.  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 The patient  underwent a MRI scan of both breasts.  The MRI of the right breast performed on 10-13-02 showed a spiculated enhancing mass within the upper inner aspect of the right breast measuring 1.3 cm.  The left breast was also done.  The patient  underwent an excisional biopsy with sentinel lymph node evaluation.  Final pathology showed a 2-cm invasive ductal carcinoma, grade 1 of 3.  Lymphovascular invasion was not seen.  A total of five lymph nodes were removed, one of which had a 4-mm focus of invasive carcinoma.  The tumor was strongly ER and PR positive. Proliferative index was 18%.  HER2/neu was 1+.  DNA index was 1. The patient's subsequent course is as detailed below.  INTERVAL HISTORY: Sara Wells returns today for followup of her breast cancer. The interval history is unremarkable. She is establishing herself in my practice today.  REVIEW OF SYSTEMS: She is tolerating the medication from B-72 with no side effects that she is aware of. She has chronic low back pain which is not more frequent or intense than prior. She has a history of mild constipation. A detailed review of systems today was otherwise entirely negative. PAST MEDICAL HISTORY: Past Medical History  Diagnosis Date  . GERD (gastroesophageal reflux disease)   .  Endometriosis   . Arthritis     right hand  . Breast cancer 10/05/2002    right    PAST SURGICAL HISTORY: Past Surgical History  Procedure Date  . Breast surgery     RIGHT & LEFT  . Abdominal hysterectomy     age 72  . Mole removal number of years prior to 2004    early melanoma  no salpingo-oophorectomy Status post tonsillectomy and adenoidectomy  FAMILY HISTORY Family History  Problem Relation Age of Onset  . Heart disease Brother   . Heart disease Brother   . Heart attack Mother   . Heart attack Father    the patient's father died at the age of 10 from a heart attack, and her mother died at the age of 56 also from a heart attack. The patient had 2 brothers, both with heart disease in their 2s. She had no sisters. There is no history of breast or ovarian cancer in the family.  GYNECOLOGIC HISTORY: Menarche age 12. The patient is GX P0. The patient had a hysterectomy at age 72. She took hormone replacement approximately 4 years.  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 She lives by herself, with no pets. She is a member of a [employer]   ADVANCED DIRECTIVES: In place. Her healthcare power of attorney is her significant other [name], who lives in [place] and may be reached at [phone]  HEALTH MAINTENANCE: History  Substance Use Topics  . Smoking status:  Never Smoker   . Smokeless tobacco: Not on file  . Alcohol Use: No     Colonoscopy:  PAP:  Bone density:  Lipid panel:  Allergies  Allergen Reactions  . Codeine Rash    Current Outpatient Prescriptions  Medication Sig Dispense Refill  . CALCIUM PO Take by mouth.        . Cholecalciferol (VITAMIN D PO) Take by mouth.        . Investigational letrozole/placebo 2.5MG  tablet NSABP B-42 Take 2.5 mg by mouth daily.        . Investigational letrozole/placebo tablet NSABP B-42 Take 1 tablet by mouth daily.  220 tablet  0  . Multiple Vitamin  (MULTIVITAMIN PO) Take by mouth.        Marland Kitchen VITAMIN E PO Take by mouth.          OBJECTIVE: Middle-aged white woman who appears well Filed Vitals:   08/26/12 0940  BP: 153/83  Pulse: 75  Temp: 97.3 F (36.3 C)  Resp: 20     Body mass index is 34.95 kg/(m^2).    ECOG FS: 0  Sclerae unicteric Oropharynx clear No cervical or supraclavicular adenopathy Lungs no rales or rhonchi Heart regular rate and rhythm Abd benign MSK no focal spinal tenderness, no peripheral edema Neuro: nonfocal, well oriented, positive affect Breasts: The right breast is status post lumpectomy and radiation. There is no evidence of local recurrence. The right axilla is benign. The left breast is status post remote benign biopsy. It is otherwise unremarkable.   LAB RESULTS: Lab Results  Component Value Date   WBC 7.5 08/13/2012   NEUTROABS 4.6 08/13/2012   HGB 15.2 08/13/2012   HCT 44.8 08/13/2012   MCV 91.7 08/13/2012   PLT 186 08/13/2012      Chemistry      Component Value Date/Time   NA 142 08/13/2012 1422   NA 141 08/24/2011 1307   K 3.9 08/13/2012 1422   K 3.8 08/24/2011 1307   CL 105 08/13/2012 1422   CL 103 08/24/2011 1307   CO2 29 08/13/2012 1422   CO2 26 08/24/2011 1307   BUN 14.0 08/13/2012 1422   BUN 13 08/24/2011 1307   CREATININE 1.0 08/13/2012 1422   CREATININE 1.17* 08/24/2011 1307      Component Value Date/Time   CALCIUM 9.4 08/13/2012 1422   CALCIUM 9.2 08/24/2011 1307   ALKPHOS 66 08/13/2012 1422   ALKPHOS 57 08/24/2011 1307   AST 31 08/13/2012 1422   AST 24 08/24/2011 1307   ALT 29 08/13/2012 1422   ALT 21 08/24/2011 1307   BILITOT 0.53 08/13/2012 1422   BILITOT 0.4 08/24/2011 1307       Lab Results  Component Value Date   LABCA2 14 08/13/2012    No components found with this basename: [id number]    No results found for this basename: INR:1;PROTIME:1 in the last 168 hours  Urinalysis No results found for this basename: colorurine, appearanceur, labspec, phurine, glucoseu, hgbur, bilirubinur, ketonesur,  proteinur, urobilinogen, nitrite, leukocytesur    STUDIES: No results found. Mammography 04/03/2012 was stable  ASSESSMENT: 72 y.o. [place] woman status post right lumpectomy and sentinel lymph node sampling 10/15/2002 for a pT1b pN1, stage IIA invasive ductal carcinoma, grade 1, estrogen receptor 92% and progesterone receptor 94% positive, with an MIB-1 of 18%, and no HER-2 amplification.  (1) treated adjuvantly with cyclophosphamide and doxorubicin x4 completed in June of 2004  (2) adjuvant radiation completed 04/10/2003  (3) on anastrozole from October  2004 to September 2009  (4) enrolled on NSABP B-42, currently 4 years into the 5 year study, receiving either letrozole or placebo  PLAN: Amoree is doing fine from a breast cancer point of view. She is having no symptoms related to her medication. She does have some hypertriglyceridemia, but no other significant risk factors for future cardiovascular events. She has an excellent functional status as noted above. The plan accordingly is to continue the medication another year. At the next visit, she will "graduate" from followup here. She knows to call for any problems that may develop before then.   Algernon Mundie C    08/26/2012

## 2012-08-26 NOTE — Telephone Encounter (Signed)
gv pt appt schedule for January 2015. °

## 2012-08-26 NOTE — Progress Notes (Signed)
08/26/12 at 10:31am - NSABP B-42 month 48 study notes- The pt was into the cancer clinic this am for her month 48 visit.  The pt was transferred to Dr. Darnelle Catalan from Dr. Renelda Loma service effective 08/06/12.  The pt was given a copy of her labs from last week.  Her height and weight were obtained today.  She returned her bottles 15 and 16.  The bottles were taken to the pharmacy for the drug accountability check by Concha Norway, pharmacist.  Bottle 15 had 29 remaining pills inside.  Bottle 16 had 4 remaining pills inside.  The pt was seen and examined today by Dr. Darnelle Catalan.  The pt denied any new health problems.  She reports that she is tolerating her study drug well.  She specifically denies any hot flashes, vaginal dryness, and any joint aches.  She states she is taking a new back pill, Vimova (naproxen 500mg /esomeprazole magnesium 20mg ) as needed for back pain.  The pt was dispensed her 2 new bottles of study drug today (Bottles 17 and 18) for self administration.  The pt will be seen in 1 year for her month 60 End of Treatment appt.  The research nurse will coordinate her month 54 visit.  The pt said that she has some "left-over" study drug in her pill box at home.  She said that she will start Bottle 17 on 09/01/12.

## 2012-09-29 ENCOUNTER — Other Ambulatory Visit: Payer: Self-pay | Admitting: *Deleted

## 2013-01-29 ENCOUNTER — Other Ambulatory Visit: Payer: Self-pay | Admitting: *Deleted

## 2013-01-29 ENCOUNTER — Telehealth: Payer: Self-pay | Admitting: *Deleted

## 2013-01-29 DIAGNOSIS — C50911 Malignant neoplasm of unspecified site of right female breast: Secondary | ICD-10-CM

## 2013-01-29 NOTE — Telephone Encounter (Signed)
01/29/2013 Received phone call from patient inquiring about annual blood work (lipid panel) to be done this summer. Patient also due for study medication exchange this month.  Arrangements made for patient to come in on Wednesday, July 23rd, for fasting lab work at 9:30am. Request sent to schedulers to enter appointment on schedule. Explained to patient that medication has not yet arrived at site, however, we are working with the NSABP to update the physician of record so that medication will be shipped.  Cindy S. Clelia Croft BSN, RN, CCRP 01/29/2013 4:15 PM

## 2013-01-30 ENCOUNTER — Telehealth: Payer: Self-pay | Admitting: Oncology

## 2013-01-30 NOTE — Telephone Encounter (Signed)
added lab for 7.23 per pof....pt already aware

## 2013-02-17 ENCOUNTER — Encounter: Payer: Self-pay | Admitting: Oncology

## 2013-02-24 ENCOUNTER — Telehealth: Payer: Self-pay | Admitting: *Deleted

## 2013-02-24 NOTE — Telephone Encounter (Signed)
Reminder call to patient regarding fasting lab appointment tomorrow morning. Also reminded patient to bring her study medication pill bottles for resupply by the research nurse. Cindy S. Clelia Croft BSN, RN, CCRP 02/24/2013 9:56 AM

## 2013-02-25 ENCOUNTER — Encounter: Payer: Medicare Other | Admitting: *Deleted

## 2013-02-25 ENCOUNTER — Other Ambulatory Visit (HOSPITAL_BASED_OUTPATIENT_CLINIC_OR_DEPARTMENT_OTHER): Payer: Medicare Other

## 2013-02-25 ENCOUNTER — Other Ambulatory Visit: Payer: Self-pay | Admitting: Physician Assistant

## 2013-02-25 DIAGNOSIS — Z79899 Other long term (current) drug therapy: Secondary | ICD-10-CM

## 2013-02-25 DIAGNOSIS — C50911 Malignant neoplasm of unspecified site of right female breast: Secondary | ICD-10-CM

## 2013-02-25 DIAGNOSIS — C50219 Malignant neoplasm of upper-inner quadrant of unspecified female breast: Secondary | ICD-10-CM

## 2013-02-25 LAB — LIPID PANEL
Cholesterol: 213 mg/dL — ABNORMAL HIGH (ref 0–200)
Triglycerides: 146 mg/dL (ref <150)
VLDL: 29 mg/dL (ref 0–40)

## 2013-02-25 MED ORDER — LETROZOLE/PLACEBO 2.5MG TABS #110 NSABP B-42: 2.5000 mg | ORAL_TABLET | Freq: Every day | ORAL | Status: DC

## 2013-02-25 NOTE — Progress Notes (Signed)
02/25/2013 Patient in to clinic today for Month 54 medication exchange and fasting labwork for lipid panel, as required by protocol. Patient returned bottle #17 with 34 tablets remaining (doses taken 09/01/2012 - 11/15/2012) and bottle #18 with 8 tablets remaining (doses taken 11/16/2012 - 02/24/2013). Patient states that she has not missed any doses of study medication. Bottles #19 and #20 were dispensed to patient today and she will begin dosing with bottle #19 today. Patient states that she is not taking any new medications.  Patient reports that she has had no fractures, hospitalizations or cardiac events. Patient's only complaint is worsening arthralgia, specifically pain in her right lower back, leg and ankle which she treats with Tylenol. This does cause some limitations of her activities, like gardening, but does not interfere with her self-care ability. She has noticed this increase in pain (grade 2 arthralgia) over the past 4-5 months. She is under no other medical treatment for this problem. Patient is aware that this will be discussed further with Dr. Darnelle Catalan for his assessment and decision regarding protocol therapy.  Patient given a calendar of appointment dates for January 2014, with instructions that last dose of study medication should be taken on Thursday, January 16th, which will complete five years of protocol therapy, unless a treatment break is indicated by Dr. Darnelle Catalan, based on the information above. Patient told that research nurse will contact her within the next two days regarding his decision.  Cindy S. Clelia Croft BSN, RN, [facility] 02/25/2013 12:47 PM

## 2013-02-26 ENCOUNTER — Telehealth: Payer: Self-pay | Admitting: *Deleted

## 2013-02-26 NOTE — Telephone Encounter (Signed)
Received return phone call from patient stating that she will stop taking study medication effective today (last dose taken yesterday evening). She will discuss her symptoms with the research nurse during the first week of September to evaluate any changes during that time. Cindy S. Clelia Croft BSN, RN, [facility] 02/26/2013 2:32 PM

## 2013-02-26 NOTE — Telephone Encounter (Signed)
Left message for patient per discussion with Dr. Darnelle Catalan - hold study medication for a period of six weeks to see if symptoms improve. Asked patient to confirm that she received this message by calling me at [phone]. Plans made to re-evaluation during the first week of September to determine status of symptoms so Dr. Darnelle Catalan can make a decision regarding further therapy. Cindy S. Clelia Croft BSN, RN, CCRP 02/26/2013 2:21 PM

## 2013-03-01 ENCOUNTER — Other Ambulatory Visit (HOSPITAL_COMMUNITY): Payer: Self-pay | Admitting: Oncology

## 2013-04-07 ENCOUNTER — Telehealth: Payer: Self-pay | Admitting: *Deleted

## 2013-04-07 NOTE — Telephone Encounter (Signed)
04/07/2013 Received telephone call from patient this morning stating that she had noticed no significant change in her symptoms of joint pain while off study medication since the end of July, therefore, she resumed treatment with NSABP B-42 study medication (letrozole vs. Placebo) on 04/05/2013. Patient calling to confirm that this is okay to do.  Patient also states that she had her last (and only) colonoscopy 10 years ago by Dr. Lina Sar. She would like to know if Dr. Darnelle Catalan would like to have her schedule another colonoscopy at this time. Patient is happy to schedule this herself; she just wants to confirm that this is okay with Dr. Darnelle Catalan.  Explained to patient that these questions will be forwarded to Dr. Darnelle Catalan for a reply. Cindy S. Clelia Croft BSN, RN, CCRP 04/07/2013 10:56 AM

## 2013-04-10 NOTE — Telephone Encounter (Signed)
04/10/2013 Spoke with patient by phone today after getting a response from Dr. Darnelle Catalan regarding study treatment. Per Dr. Darnelle Catalan, patient may continue on study medication as planned through January 2015. Patient will discuss joint pain with her PCP at her next visit in October. Patient also instructed to schedule her screening colonoscopy, as patient prefers to schedule this herself. However, if patient needs any assistance with scheduling this appointment, she may contact our office. Cindy S. Clelia Croft BSN, RN, CCRP 04/10/2013 10:10 AM

## 2013-04-27 ENCOUNTER — Encounter: Payer: Self-pay | Admitting: Internal Medicine

## 2013-05-01 ENCOUNTER — Other Ambulatory Visit: Payer: Self-pay | Admitting: *Deleted

## 2013-05-20 ENCOUNTER — Encounter: Payer: Self-pay | Admitting: Internal Medicine

## 2013-07-08 ENCOUNTER — Ambulatory Visit (AMBULATORY_SURGERY_CENTER): Payer: Self-pay | Admitting: *Deleted

## 2013-07-08 VITALS — Ht 63.0 in | Wt 197.0 lb

## 2013-07-08 DIAGNOSIS — Z1211 Encounter for screening for malignant neoplasm of colon: Secondary | ICD-10-CM

## 2013-07-08 MED ORDER — MOVIPREP 100 G PO SOLR: ORAL | Status: DC

## 2013-07-08 NOTE — Progress Notes (Signed)
Patient denies any allergies to eggs or soy. Patient had vomiting after hysterectomy many years ago. No problems after colonoscopy per patient.

## 2013-07-10 ENCOUNTER — Encounter: Payer: Self-pay | Admitting: Internal Medicine

## 2013-07-22 ENCOUNTER — Ambulatory Visit (AMBULATORY_SURGERY_CENTER): Payer: Medicare Other | Admitting: Internal Medicine

## 2013-07-22 ENCOUNTER — Encounter: Payer: Self-pay | Admitting: Internal Medicine

## 2013-07-22 VITALS — BP 143/67 | HR 69 | Temp 98.4°F | Resp 21 | Ht 63.0 in | Wt 197.0 lb

## 2013-07-22 DIAGNOSIS — Z1211 Encounter for screening for malignant neoplasm of colon: Secondary | ICD-10-CM

## 2013-07-22 MED ORDER — SODIUM CHLORIDE 0.9 % IV SOLN: 500.0000 mL | INTRAVENOUS | Status: DC

## 2013-07-22 NOTE — Op Note (Signed)
[facility] [address] [place], [address]   COLONOSCOPY PROCEDURE REPORT  PATIENT: [name], Sara Wells  MR#: [id number] BIRTHDATE: May 14, 1941 , 72  yrs. old GENDER: Female ENDOSCOPIST: Hart Carwin, MD REFERRED AO:[id number] Tisovec, M.D. PROCEDURE DATE:  07/22/2013 PROCEDURE:   Colonoscopy, screening First Screening Colonoscopy - Avg.  risk and is 50 yrs.  old or older - No.  Prior Negative Screening - Now for repeat screening. Above average risk  History of Adenoma - Now for follow-up colonoscopy & has been > or = to 3 yrs.  N/A  Polyps Removed Today? No.  Recommend repeat exam, <10 yrs? No. ASA CLASS:   Class II INDICATIONS:last colonoscopy December 2004 was normal.  History of breast cancer in 2003. MEDICATIONS: MAC sedation, administered by CRNA and Propofol (Diprivan) 230 mg IV  DESCRIPTION OF PROCEDURE:   After the risks benefits and alternatives of the procedure were thoroughly explained, informed consent was obtained.  A digital rectal exam revealed no abnormalities of the rectum.   The LB [id number]  endoscope was introduced through the anus and advanced to the cecum, which was identified by both the appendix and ileocecal valve. No adverse events experienced.   The quality of the prep was good, using MoviPrep  The instrument was then slowly withdrawn as the colon was fully examined.      COLON FINDINGS: A normal appearing cecum, ileocecal valve, and appendiceal orifice were identified.  The ascending, hepatic flexure, transverse, splenic flexure, descending, sigmoid colon and rectum appeared unremarkable.  No polyps or cancers were seen. Retroflexed views revealed no abnormalities. The time to cecum=4 minutes 44 seconds.  Withdrawal time=6 minutes 2 seconds.  The scope was withdrawn and the procedure completed. COMPLICATIONS: There were no complications.  ENDOSCOPIC IMPRESSION: Normal colon mild diverticulosis of the descending and  sigmoid colon  RECOMMENDATIONS: high-fiber diet Repeat colonoscopy in 10 years   eSigned:  Hart Carwin, MD 07/22/2013 2:40 PM   cc:   PATIENT NAME:  Sara Wells, Sara Wells MR#: [id number]

## 2013-07-22 NOTE — Progress Notes (Signed)
Patient did not experience any of the following events: a burn prior to discharge; a fall within the facility; wrong site/side/patient/procedure/implant event; or a hospital transfer or hospital admission upon discharge from the facility. (G8907) Patient did not have preoperative order for IV antibiotic SSI prophylaxis. (G8918)  

## 2013-07-22 NOTE — Patient Instructions (Signed)
Discharge instructions given with verbal understanding. Handouts on diverticulosis and a high fiber diet. Resume previous medications. YOU HAD AN ENDOSCOPIC PROCEDURE TODAY AT THE [facility]: Refer to the procedure report that was given to you for any specific questions about what was found during the examination.  If the procedure report does not answer your questions, please call your gastroenterologist to clarify.  If you requested that your care partner not be given the details of your procedure findings, then the procedure report has been included in a sealed envelope for you to review at your convenience later.  YOU SHOULD EXPECT: Some feelings of bloating in the abdomen. Passage of more gas than usual.  Walking can help get rid of the air that was put into your GI tract during the procedure and reduce the bloating. If you had a lower endoscopy (such as a colonoscopy or flexible sigmoidoscopy) you may notice spotting of blood in your stool or on the toilet paper. If you underwent a bowel prep for your procedure, then you may not have a normal bowel movement for a few days.  DIET: Your first meal following the procedure should be a light meal and then it is ok to progress to your normal diet.  A half-sandwich or bowl of soup is an example of a good first meal.  Heavy or fried foods are harder to digest and may make you feel nauseous or bloated.  Likewise meals heavy in dairy and vegetables can cause extra gas to form and this can also increase the bloating.  Drink plenty of fluids but you should avoid alcoholic beverages for 24 hours.  ACTIVITY: Your care partner should take you home directly after the procedure.  You should plan to take it easy, moving slowly for the rest of the day.  You can resume normal activity the day after the procedure however you should NOT DRIVE or use heavy machinery for 24 hours (because of the sedation medicines used during the test).    SYMPTOMS TO REPORT  IMMEDIATELY: A gastroenterologist can be reached at any hour.  During normal business hours, 8:30 AM to 5:00 PM Monday through Friday, call [phone].  After hours and on weekends, please call the GI answering service at [phone] who will take a message and have the physician on call contact you.   Following lower endoscopy (colonoscopy or flexible sigmoidoscopy):  Excessive amounts of blood in the stool  Significant tenderness or worsening of abdominal pains  Swelling of the abdomen that is new, acute  Fever of 100F or higher FOLLOW UP: If any biopsies were taken you will be contacted by phone or by letter within the next 1-3 weeks.  Call your gastroenterologist if you have not heard about the biopsies in 3 weeks.  Our staff will call the home number listed on your records the next business day following your procedure to check on you and address any questions or concerns that you may have at that time regarding the information given to you following your procedure. This is a courtesy call and so if there is no answer at the home number and we have not heard from you through the emergency physician on call, we will assume that you have returned to your regular daily activities without incident.  SIGNATURES/CONFIDENTIALITY: You and/or your care partner have signed paperwork which will be entered into your electronic medical record.  These signatures attest to the fact that that the information above on your After   Visit Summary has been reviewed and is understood.  Full responsibility of the confidentiality of this discharge information lies with you and/or your care-partner. 

## 2013-07-22 NOTE — Progress Notes (Signed)
Procedure ends, to recovery, report given and VSS. 

## 2013-07-23 ENCOUNTER — Telehealth: Payer: Self-pay

## 2013-07-23 NOTE — Telephone Encounter (Signed)
  Follow up Call-  Call back number 07/22/2013  Post procedure Call Back phone  # [phone]  Permission to leave phone message Yes     Patient questions:  Do you have a fever, pain , or abdominal swelling? no Pain Score  0 *  Have you tolerated food without any problems? yes  Have you been able to return to your normal activities? yes  Do you have any questions about your discharge instructions: Diet   no Medications  no Follow up visit  no  Do you have questions or concerns about your Care? no  Actions: * If pain score is 4 or above: No action needed, pain <4.

## 2013-08-17 ENCOUNTER — Telehealth: Payer: Self-pay | Admitting: *Deleted

## 2013-08-17 NOTE — Telephone Encounter (Signed)
08/17/2013 Spoke with patient today to let her know that I will not see her at her lab appointment this week, but will follow-up with patient at her office visit next week. Reminded patient to take last dose of study medication on January 16th, and to return all study medication bottles at next week's visit.  Patient reports that she underwent a colonoscopy in December and missed three doses of study medication during that time. Doses were not taken the day before, day of and day after her procedure (Decemeber 16th-18th). Assured patient that this was acceptable.  Cindy S. Brigitte Pulse BSN, RN, CCRP 08/17/2013 1:20 PM

## 2013-08-18 ENCOUNTER — Other Ambulatory Visit: Payer: Self-pay | Admitting: Physician Assistant

## 2013-08-18 DIAGNOSIS — C50919 Malignant neoplasm of unspecified site of unspecified female breast: Secondary | ICD-10-CM

## 2013-08-19 ENCOUNTER — Other Ambulatory Visit (HOSPITAL_BASED_OUTPATIENT_CLINIC_OR_DEPARTMENT_OTHER): Payer: Medicare Other

## 2013-08-19 DIAGNOSIS — C50911 Malignant neoplasm of unspecified site of right female breast: Secondary | ICD-10-CM

## 2013-08-19 DIAGNOSIS — C50219 Malignant neoplasm of upper-inner quadrant of unspecified female breast: Secondary | ICD-10-CM

## 2013-08-19 DIAGNOSIS — Z79899 Other long term (current) drug therapy: Secondary | ICD-10-CM

## 2013-08-19 DIAGNOSIS — C50919 Malignant neoplasm of unspecified site of unspecified female breast: Secondary | ICD-10-CM

## 2013-08-19 LAB — CBC WITH DIFFERENTIAL/PLATELET
BASO%: 0.4 % (ref 0.0–2.0)
Basophils Absolute: 0 10*3/uL (ref 0.0–0.1)
EOS%: 1.3 % (ref 0.0–7.0)
Eosinophils Absolute: 0.1 10*3/uL (ref 0.0–0.5)
HEMATOCRIT: 46.3 % (ref 34.8–46.6)
HGB: 15.5 g/dL (ref 11.6–15.9)
LYMPH%: 27.4 % (ref 14.0–49.7)
MCH: 30.5 pg (ref 25.1–34.0)
MCHC: 33.4 g/dL (ref 31.5–36.0)
MCV: 91.2 fL (ref 79.5–101.0)
MONO#: 0.7 10*3/uL (ref 0.1–0.9)
MONO%: 8.5 % (ref 0.0–14.0)
NEUT#: 4.8 10*3/uL (ref 1.5–6.5)
NEUT%: 62.4 % (ref 38.4–76.8)
Platelets: 183 10*3/uL (ref 145–400)
RBC: 5.07 10*6/uL (ref 3.70–5.45)
RDW: 13.4 % (ref 11.2–14.5)
WBC: 7.7 10*3/uL (ref 3.9–10.3)
lymph#: 2.1 10*3/uL (ref 0.9–3.3)

## 2013-08-19 LAB — COMPREHENSIVE METABOLIC PANEL (CC13)
ALT: 18 U/L (ref 0–55)
AST: 18 U/L (ref 5–34)
Albumin: 3.6 g/dL (ref 3.5–5.0)
Alkaline Phosphatase: 62 U/L (ref 40–150)
Anion Gap: 10 mEq/L (ref 3–11)
BUN: 12 mg/dL (ref 7.0–26.0)
CALCIUM: 9.5 mg/dL (ref 8.4–10.4)
CO2: 28 mEq/L (ref 22–29)
CREATININE: 0.8 mg/dL (ref 0.6–1.1)
Chloride: 104 mEq/L (ref 98–109)
Glucose: 106 mg/dl (ref 70–140)
Potassium: 3.7 mEq/L (ref 3.5–5.1)
Sodium: 142 mEq/L (ref 136–145)
Total Bilirubin: 0.58 mg/dL (ref 0.20–1.20)
Total Protein: 7 g/dL (ref 6.4–8.3)

## 2013-08-26 ENCOUNTER — Ambulatory Visit: Payer: Medicare Other | Admitting: Physician Assistant

## 2013-08-26 ENCOUNTER — Ambulatory Visit (HOSPITAL_BASED_OUTPATIENT_CLINIC_OR_DEPARTMENT_OTHER): Payer: Medicare Other | Admitting: Oncology

## 2013-08-26 ENCOUNTER — Encounter: Payer: Medicare Other | Admitting: *Deleted

## 2013-08-26 VITALS — BP 136/75 | HR 70 | Temp 98.2°F | Resp 20 | Ht 63.0 in | Wt 196.4 lb

## 2013-08-26 DIAGNOSIS — C50919 Malignant neoplasm of unspecified site of unspecified female breast: Secondary | ICD-10-CM

## 2013-08-26 DIAGNOSIS — Z853 Personal history of malignant neoplasm of breast: Secondary | ICD-10-CM

## 2013-08-26 NOTE — Progress Notes (Signed)
ID: Sara Wells   DOB: Nov 05, 1940  MR#: [id number]  RJJ#:[id number]  PCP: Haywood Pao, MD GYN:  SUOsborn Coho OTHER MD: Johnnette Gourd  HISTORY OF PRESENT ILLNESS: Routine scanning mammography on 09-07-02 did show the visible appearance of some right breast architectural distortion.  Subsequent additional views shows a suspicious abnormality in the upper inner quadrant of the right breast measuring approximately 1.5 to 2.0 cm. Ultrasound was also done on 10-05-02 and confirmed the presence of a hypoechoic mass measuring about 4-5 cm in about the 1 o'clock position 8 cm from the right nipple.  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 The patient  underwent a MRI scan of both breasts.  The MRI of the right breast performed on 10-13-02 showed a spiculated enhancing mass within the upper inner aspect of the right breast measuring 1.3 cm.  The left breast was also done.  The patient  underwent an excisional biopsy with sentinel lymph node evaluation.  Final pathology showed a 2-cm invasive ductal carcinoma, grade 1 of 3.  Lymphovascular invasion was not seen.  A total of five lymph nodes were removed, one of which had a 4-mm focus of invasive carcinoma.  The tumor was strongly ER and PR positive. Proliferative index was 18%.  HER2/neu was 1+.  DNA index was 1. The patient's subsequent course is as detailed below.  INTERVAL HISTORY: Sara Wells returns today for followup of her breast cancer. The interval history is unremarkable. She completed the aromatase inhibitors/placebo, last dose 08/21/2013.  REVIEW OF SYSTEMS: Whether she is taking an aromatase inhibitor or a placebo, she has no symptoms associated with the treatment. She does have some arthritis pain but here and there", which is not more intense or persistent than before. She denies vaginal dryness or hot flashes. She has mild heartburn, occasional headaches, and feels forgetful "sometimes". Overall a detailed review  of systems today was entirely stable.  PAST MEDICAL HISTORY: Past Medical History  Diagnosis Date  . GERD (gastroesophageal reflux disease)   . Endometriosis   . Arthritis     right hand  . Breast cancer 10/05/2002    right    PAST SURGICAL HISTORY: Past Surgical History  Procedure Laterality Date  . Breast surgery  2004&2009    RIGHT & LEFT  . Abdominal hysterectomy      age 34  . Mole removal  number of years prior to 2004    early melanoma  no salpingo-oophorectomy Status post tonsillectomy and adenoidectomy  FAMILY HISTORY Family History  Problem Relation Age of Onset  . Heart disease Brother   . Heart disease Brother   . Heart attack Mother   . Heart attack Father   . Colon cancer Neg Hx    the patient's father died at the age of 83 from a heart attack, and her mother died at the age of 64 also from a heart attack. The patient had 2 brothers, both with heart disease in their 62s. She had no sisters. There is no history of breast or ovarian cancer in the family.  GYNECOLOGIC HISTORY: Menarche age 83. The patient is GX P0. The patient had a hysterectomy at age 10. She took hormone replacement approximately 4 years.  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 She lives by herself, with no pets. She is a member of a local [employer]: [employer] [place]. Her healthcare power of attorney  is her significant other Christeen Douglas, who lives in [place] and may be reached at [place]: History  Substance Use Topics  . Smoking status: Never Smoker   . Smokeless tobacco: Never Used  . Alcohol Use: No     Colonoscopy:  PAP:  Bone density:  Lipid panel:  Allergies  Allergen Reactions  . Codeine Rash    Current Outpatient Prescriptions  Medication Sig Dispense Refill  . CALCIUM PO Take by mouth.        . Cholecalciferol (VITAMIN D PO) Take by mouth.        . esomeprazole  (NEXIUM) 40 MG capsule Take 40 mg by mouth as needed.      . Multiple Vitamin (MULTIVITAMIN PO) Take by mouth.        Marland Kitchen VITAMIN E PO Take by mouth.         No current facility-administered medications for this visit.    OBJECTIVE: Middle-aged white woman in no acute distress Filed Vitals:   08/26/13 1356  BP: 136/75  Pulse: 70  Temp: 98.2 F (36.8 C)  Resp: 20     Body mass index is 34.8 kg/(m^2).    ECOG FS: 0  Sclerae unicteric, pupils equal round Oropharynx no thrush or other lesions No cervical or supraclavicular adenopathy Lungs no rales or rhonchi Heart regular rate and rhythm Abd soft, nontender, positive bowel sounds MSK no focal spinal tenderness, no peripheral edema Neuro: nonfocal, well oriented, positive affect Breasts: The right breast is status post lumpectomy and radiation. There is no evidence of local recurrence. The right axilla is benign. The left breast is status post remote benign biopsy. It is otherwise unremarkable.   LAB RESULTS: Lab Results  Component Value Date   WBC 7.7 08/19/2013   NEUTROABS 4.8 08/19/2013   HGB 15.5 08/19/2013   HCT 46.3 08/19/2013   MCV 91.2 08/19/2013   PLT 183 08/19/2013      Chemistry      Component Value Date/Time   NA 142 08/19/2013 1327   NA 141 08/24/2011 1307   K 3.7 08/19/2013 1327   K 3.8 08/24/2011 1307   CL 105 08/13/2012 1422   CL 103 08/24/2011 1307   CO2 28 08/19/2013 1327   CO2 26 08/24/2011 1307   BUN 12.0 08/19/2013 1327   BUN 13 08/24/2011 1307   CREATININE 0.8 08/19/2013 1327   CREATININE 1.17* 08/24/2011 1307      Component Value Date/Time   CALCIUM 9.5 08/19/2013 1327   CALCIUM 9.2 08/24/2011 1307   ALKPHOS 62 08/19/2013 1327   ALKPHOS 57 08/24/2011 1307   AST 18 08/19/2013 1327   AST 24 08/24/2011 1307   ALT 18 08/19/2013 1327   ALT 21 08/24/2011 1307   BILITOT 0.58 08/19/2013 1327   BILITOT 0.4 08/24/2011 1307       Lab Results  Component Value Date   LABCA2 14 08/13/2012    No components found with  this basename: [id number]    No results found for this basename: INR,  in the last 168 hours  Urinalysis No results found for this basename: colorurine,  appearanceur,  labspec,  phurine,  glucoseu,  hgbur,  bilirubinur,  ketonesur,  proteinur,  urobilinogen,  nitrite,  leukocytesur    STUDIES: Routine screening mammography at [facility] 04/07/2013 was benign   ASSESSMENT: 73 y.o. [place] woman status post right lumpectomy and sentinel lymph node sampling 10/15/2002 for a pT1b pN1, stage IIA invasive ductal carcinoma, grade 1, estrogen  receptor 92% and progesterone receptor 94% positive, with an MIB-1 of 18%, and no HER-2 amplification.  (1) treated adjuvantly with cyclophosphamide and doxorubicin x4 completed in June of 2004  (2) adjuvant radiation completed 04/10/2003  (3) on anastrozole from October 2004 to September 2009  (4) enrolled on NSABP B-42, completed 5 years on study January 2015, receiving either letrozole or placebo  PLAN: Sara Wells is ready to "graduate" from breast cancer followup. She has an excellent prognosis. Of course she will need yearly screening mammography and a yearly physician breast exam. We will be glad to see her at any point in the future as needed, but as of now we are making no further routine appointment for her here.  MAGRINAT,GUSTAV C    08/26/2013

## 2013-08-26 NOTE — Progress Notes (Signed)
08/26/2013 Patient in to clinic today for Month 60 visit. Patient reports taking her final dose of study medication on Friday, January 16th. She missed three doses as noted in telephone call encounter on 08/17/2013, in addition to the 38-day treatment break in July and August.  Patient denies any new side effects, and reports that her joint pains are essentially unchanged. She has not experienced any blood clots, bone fractures, cardiac events or hospitalizations. She took no new medications during the past six months. Study medication bottles #19 (taken 02/25/13 - 05/16/13, excluding treatment break) and #20 (taken 05/17/13 - 08/21/13) were returned by patient today and taken to pharmacist Raul Del for drug accountability. Cindy S. Brigitte Pulse BSN, RN, [place] 08/26/2013 3:31 PM  08/26/2013 Per pharmacist, patient returned bottle #19 with 67 tablets remaining and bottle #20 with 20 tablets remaining. Cindy S. Brigitte Pulse BSN, RN, [facility] 08/26/2013 3:51 PM

## 2013-08-27 ENCOUNTER — Telehealth: Payer: Self-pay | Admitting: Oncology

## 2013-08-27 NOTE — Telephone Encounter (Signed)
sw. pt and advised on Bone density appt at [facility] on March 4th @ 1pm

## 2013-08-31 ENCOUNTER — Telehealth: Payer: Self-pay | Admitting: Oncology

## 2013-08-31 NOTE — Telephone Encounter (Signed)
Letter sent to Dr. Domenick Gong office from Dr. Jana Hakim.

## 2014-02-19 ENCOUNTER — Telehealth: Payer: Self-pay | Admitting: *Deleted

## 2014-02-19 NOTE — Telephone Encounter (Signed)
02/19/2014 Received return phone call from patient who states she is generally doing well. She notes that she has continued back pain, particularly when working in the yard. There was no significant improvement since stopping study medication in January, and she is currently seeing a chiropractor for management of her back pain. Patient states that she received the shingles vaccine on January 21st and had a bone density scan on March 4th, but otherwise has had no healthcare encounters. She is due for her annual physical exam with her PCP in October. She has had no blood clots, bone fractures or hospitalizations and she has had no changes in her medications. Patient is aware that we will continue collecting clinical data from her other physicians, and that she may be contacted by the [place] if additional information is needed. Thanked patient for her participation in the research study. Cindy S. Brigitte Pulse BSN, RN, [place] 02/19/2014 2:10 PM

## 2014-02-19 NOTE — Telephone Encounter (Signed)
02/19/2014 Left message for patient to call me regarding the final adverse event assessment for the NSABP B-42 clinical trial. Asked that she contact me directly at [phone], to let me know how her health has been in the last six months, since stopping study medication in January. Cindy S. Brigitte Pulse BSN, RN, [place] 02/19/2014 12:38 PM

## 2014-05-29 ENCOUNTER — Other Ambulatory Visit: Payer: Self-pay | Admitting: Nurse Practitioner

## 2014-09-07 ENCOUNTER — Telehealth: Payer: Self-pay | Admitting: Oncology

## 2014-09-07 NOTE — Telephone Encounter (Signed)
09/07/14 - Received returned phone call from patient who states she is doing well.  She did state that she still has continued back pain which she said the research nurse already knew about it..  There are no other problems.  She stated she has not been in the hospital in the last year.  She has not had any fractures or broken bones in the last year.  She only takes calcium and nothing else for her bones.  She has not had a bone density test.  She has not had any cardiac events or thrombotic events in the last year.  She has her mammograms done at [facility] in Sept or Oct.  I told her I would call her again next year.  If she needed anything before then to give [place] a call.  I thanked the patient for her continued support in this clinical trial. Remer Macho 09/07/14 - 4:15 PM

## 2015-04-14 ENCOUNTER — Ambulatory Visit: Payer: Self-pay | Admitting: Cardiology

## 2015-07-26 ENCOUNTER — Ambulatory Visit (INDEPENDENT_AMBULATORY_CARE_PROVIDER_SITE_OTHER): Payer: Medicare Other | Admitting: Internal Medicine

## 2015-07-26 VITALS — BP 138/78 | HR 82 | Temp 98.2°F | Resp 16 | Ht 63.0 in | Wt 193.0 lb

## 2015-07-26 DIAGNOSIS — R062 Wheezing: Secondary | ICD-10-CM

## 2015-07-26 DIAGNOSIS — R509 Fever, unspecified: Secondary | ICD-10-CM | POA: Diagnosis not present

## 2015-07-26 DIAGNOSIS — R05 Cough: Secondary | ICD-10-CM | POA: Diagnosis not present

## 2015-07-26 DIAGNOSIS — R059 Cough, unspecified: Secondary | ICD-10-CM

## 2015-07-26 MED ORDER — ALBUTEROL SULFATE (2.5 MG/3ML) 0.083% IN NEBU
2.5000 mg | INHALATION_SOLUTION | Freq: Once | RESPIRATORY_TRACT | Status: AC
  Administered 2015-07-26: 2.5 mg via RESPIRATORY_TRACT

## 2015-07-26 MED ORDER — ALBUTEROL SULFATE HFA 108 (90 BASE) MCG/ACT IN AERS: 2.0000 | INHALATION_SPRAY | Freq: Four times a day (QID) | RESPIRATORY_TRACT | Status: DC | PRN

## 2015-07-26 MED ORDER — IPRATROPIUM BROMIDE 0.02 % IN SOLN
0.5000 mg | Freq: Once | RESPIRATORY_TRACT | Status: AC
  Administered 2015-07-26: 0.5 mg via RESPIRATORY_TRACT

## 2015-07-26 MED ORDER — METHYLPREDNISOLONE ACETATE 80 MG/ML IJ SUSP
80.0000 mg | Freq: Once | INTRAMUSCULAR | Status: AC
Start: 2015-07-26 — End: 2015-07-26
  Administered 2015-07-26: 80 mg via INTRAMUSCULAR

## 2015-07-26 MED ORDER — AZITHROMYCIN 250 MG PO TABS: ORAL_TABLET | ORAL | Status: DC

## 2015-07-26 NOTE — Patient Instructions (Addendum)
Fever, Adult A fever is an increase in the body's temperature. It is usually defined as a temperature of 100F (38C) or higher. Brief mild or moderate fevers generally have no Borkowski-term effects, and they often do not require treatment. Moderate or high fevers may make you feel uncomfortable and can sometimes be a sign of a serious illness or disease. The sweating that may occur with repeated or prolonged fever may also cause dehydration. Fever is confirmed by taking a temperature with a thermometer. A measured temperature can vary with:  Age.  Time of day.  Location of the thermometer:  Mouth (oral).  Rectum (rectal).  Ear (tympanic).  Underarm (axillary).  Forehead (temporal). HOME CARE INSTRUCTIONS Pay attention to any changes in your symptoms. Take these actions to help with your condition:  Take over-the counter and prescription medicines only as told by your health care provider. Follow the dosing instructions carefully.  If you were prescribed an antibiotic medicine, take it as told by your health care provider. Do not stop taking the antibiotic even if you start to feel better.  Rest as needed.  Drink enough fluid to keep your urine clear or pale yellow. This helps to prevent dehydration.  Sponge yourself or bathe with room-temperature water to help reduce your body temperature as needed. Do not use ice water.  Do not overbundle yourself in blankets or heavy clothes. SEEK MEDICAL CARE IF:  You vomit.  You cannot eat or drink without vomiting.  You have diarrhea.  You have pain when you urinate.  Your symptoms do not improve with treatment.  You develop new symptoms.  You develop excessive weakness. SEEK IMMEDIATE MEDICAL CARE IF:  You have shortness of breath or have trouble breathing.  You are dizzy or you faint.  You are disoriented or confused.  You develop signs of dehydration, such as a dry mouth, decreased urination, or paleness.  You develop  severe pain in your abdomen.  You have persistent vomiting or diarrhea.  You develop a skin rash.  Your symptoms suddenly get worse.   This information is not intended to replace advice given to you by your health care provider. Make sure you discuss any questions you have with your health care provider.   Document Released: 01/16/2001 Document Revised: 04/13/2015 Document Reviewed: 09/16/2014 Elsevier Interactive Patient Education 2016 Elsevier Inc[employer] Bronchospasm, Adult A bronchospasm is a spasm or tightening of the airways going into the lungs. During a bronchospasm breathing becomes more difficult because the airways get smaller. When this happens there can be coughing, a whistling sound when breathing (wheezing), and difficulty breathing. Bronchospasm is often associated with asthma, but not all patients who experience a bronchospasm have asthma. CAUSES  A bronchospasm is caused by inflammation or irritation of the airways. The inflammation or irritation may be triggered by:   Allergies (such as to animals, pollen, food, or mold). Allergens that cause bronchospasm may cause wheezing immediately after exposure or many hours later.   Infection. Viral infections are believed to be the most common cause of bronchospasm.   Exercise.   Irritants (such as pollution, cigarette smoke, strong odors, aerosol sprays, and paint fumes).   Weather changes. Winds increase molds and pollens in the air. Rain refreshes the air by washing irritants out. Cold air may cause inflammation.   Stress and emotional upset.  SIGNS AND SYMPTOMS   Wheezing.   Excessive nighttime coughing.   Frequent or severe coughing with a simple cold.   Chest tightness.  Shortness of breath.  DIAGNOSIS  Bronchospasm is usually diagnosed through a history and physical exam. Tests, such as chest X-rays, are sometimes done to look for other conditions. TREATMENT   Inhaled medicines can be given to open up  your airways and help you breathe. The medicines can be given using either an inhaler or a nebulizer machine.  Corticosteroid medicines may be given for severe bronchospasm, usually when it is associated with asthma. HOME CARE INSTRUCTIONS   Always have a plan prepared for seeking medical care. Know when to call your health care provider and local emergency services (911 in the [place] Know where you can access local emergency care.  Only take medicines as directed by your health care provider.  If you were prescribed an inhaler or nebulizer machine, ask your health care provider to explain how to use it correctly. Always use a spacer with your inhaler if you were given one.  It is necessary to remain calm during an attack. Try to relax and breathe more slowly.  Control your home environment in the following ways:   Change your heating and air conditioning filter at least once a month.   Limit your use of fireplaces and wood stoves.  Do not smoke and do not allow smoking in your home.   Avoid exposure to perfumes and fragrances.   Get rid of pests (such as roaches and mice) and their droppings.   Throw away plants if you see mold on them.   Keep your house clean and dust free.   Replace carpet with wood, tile, or vinyl flooring. Carpet can trap dander and dust.   Use allergy-proof pillows, mattress covers, and box spring covers.   Wash bed sheets and blankets every week in hot water and dry them in a dryer.   Use blankets that are made of polyester or cotton.   Wash hands frequently. SEEK MEDICAL CARE IF:   You have muscle aches.   You have chest pain.   The sputum changes from clear or white to yellow, green, gray, or bloody.   The sputum you cough up gets thicker.   There are problems that may be related to the medicine you are given, such as a rash, itching, swelling, or trouble breathing.  SEEK IMMEDIATE MEDICAL CARE IF:   You have worsening  wheezing and coughing even after taking your prescribed medicines.   You have increased difficulty breathing.   You develop severe chest pain. MAKE SURE YOU:   Understand these instructions.  Will watch your condition.  Will get help right away if you are not doing well or get worse.   This information is not intended to replace advice given to you by your health care provider. Make sure you discuss any questions you have with your health care provider.   Document Released: 07/26/2003 Document Revised: 08/13/2014 Document Reviewed: 01/12/2013 Elsevier Interactive Patient Education 2016 Elsevier Inc. Sinusitis, Adult Sinusitis is redness, soreness, and inflammation of the paranasal sinuses. Paranasal sinuses are air pockets within the bones of your face. They are located beneath your eyes, in the middle of your forehead, and above your eyes. In healthy paranasal sinuses, mucus is able to drain out, and air is able to circulate through them by way of your nose. However, when your paranasal sinuses are inflamed, mucus and air can become trapped. This can allow bacteria and other germs to grow and cause infection. Sinusitis can develop quickly and last only a short time (acute) or continue  over a Leason period (chronic). Sinusitis that lasts for more than 12 weeks is considered chronic. CAUSES Causes of sinusitis include:  Allergies.  Structural abnormalities, such as displacement of the cartilage that separates your nostrils (deviated septum), which can decrease the air flow through your nose and sinuses and affect sinus drainage.  Functional abnormalities, such as when the small hairs (cilia) that line your sinuses and help remove mucus do not work properly or are not present. SIGNS AND SYMPTOMS Symptoms of acute and chronic sinusitis are the same. The primary symptoms are pain and pressure around the affected sinuses. Other symptoms include:  Upper  toothache.  Earache.  Headache.  Bad breath.  Decreased sense of smell and taste.  A cough, which worsens when you are lying flat.  Fatigue.  Fever.  Thick drainage from your nose, which often is green and may contain pus (purulent).  Swelling and warmth over the affected sinuses. DIAGNOSIS Your health care provider will perform a physical exam. During your exam, your health care provider may perform any of the following to help determine if you have acute sinusitis or chronic sinusitis:  Look in your nose for signs of abnormal growths in your nostrils (nasal polyps).  Tap over the affected sinus to check for signs of infection.  View the inside of your sinuses using an imaging device that has a light attached (endoscope). If your health care provider suspects that you have chronic sinusitis, one or more of the following tests may be recommended:  Allergy tests.  Nasal culture. A sample of mucus is taken from your nose, sent to a lab, and screened for bacteria.  Nasal cytology. A sample of mucus is taken from your nose and examined by your health care provider to determine if your sinusitis is related to an allergy. TREATMENT Most cases of acute sinusitis are related to a viral infection and will resolve on their own within 10 days. Sometimes, medicines are prescribed to help relieve symptoms of both acute and chronic sinusitis. These may include pain medicines, decongestants, nasal steroid sprays, or saline sprays. However, for sinusitis related to a bacterial infection, your health care provider will prescribe antibiotic medicines. These are medicines that will help kill the bacteria causing the infection. Rarely, sinusitis is caused by a fungal infection. In these cases, your health care provider will prescribe antifungal medicine. For some cases of chronic sinusitis, surgery is needed. Generally, these are cases in which sinusitis recurs more than 3 times per year, despite  other treatments. HOME CARE INSTRUCTIONS  Drink plenty of water. Water helps thin the mucus so your sinuses can drain more easily.  Use a humidifier.  Inhale steam 3-4 times a day (for example, sit in the bathroom with the shower running).  Apply a warm, moist washcloth to your face 3-4 times a day, or as directed by your health care provider.  Use saline nasal sprays to help moisten and clean your sinuses.  Take medicines only as directed by your health care provider.  If you were prescribed either an antibiotic or antifungal medicine, finish it all even if you start to feel better. SEEK IMMEDIATE MEDICAL CARE IF:  You have increasing pain or severe headaches.  You have nausea, vomiting, or drowsiness.  You have swelling around your face.  You have vision problems.  You have a stiff neck.  You have difficulty breathing.   This information is not intended to replace advice given to you by your health care provider. Make  sure you discuss any questions you have with your health care provider.   Document Released: 07/23/2005 Document Revised: 08/13/2014 Document Reviewed: 08/07/2011 Elsevier Interactive Patient Education [employer].

## 2015-07-26 NOTE — Progress Notes (Signed)
Patient ID: Sara Wells, female   DOB: [birth year], 74 y.o.   MRN: [id number]   07/26/2015 at 12:42 PM  Sara Wells / DOB: 11/14/1940 / MRN: [id number]  Problem list reviewed and updated by me where necessary.   SUBJECTIVE  Sara Wells is a 74 y.o. well appearing female presenting for the chief complaint of wheezing and cough.. She has never had wheezes prior to this.  She has no medical disorders, is on no medications. She has off and on low grade fever.Sputum is dark, she is in no distress.    She  has a past medical history of GERD (gastroesophageal reflux disease); Endometriosis; Arthritis; and Breast cancer ([place]) (10/05/2002).    Medications reviewed and updated by myself where necessary, and exist elsewhere in the encounter.   Sara Wells is allergic to codeine. She  reports that she has never smoked. She has never used smokeless tobacco. She reports that she does not drink alcohol or use illicit drugs. She  has no sexual activity history on file. The patient  has past surgical history that includes Breast surgery (2004&2009); Abdominal hysterectomy; and Mole removal (number of years prior to 2004).  Her family history includes Heart attack in her father and mother; Heart disease in her brother and brother. There is no history of Colon cancer.  Review of Systems  Constitutional: Positive for fever, chills and malaise/fatigue.  HENT: Positive for congestion.   Respiratory: Positive for cough, sputum production, shortness of breath and wheezing.   Cardiovascular: Negative for chest pain.  Gastrointestinal: Negative for nausea.  Skin: Negative for rash.  Neurological: Negative for dizziness and headaches.    OBJECTIVE  Her  height is 5\' 3"  (1.6 m) and weight is 193 lb (87.544 kg). Her oral temperature is 98.2 F (36.8 C). Her blood pressure is 138/78 and her pulse is 82. Her respiration is 16 and oxygen saturation is 96%.  The patient's body mass index is 34.2 kg/(m^2).  Physical Exam   Constitutional: She is oriented to person, place, and time. She appears well-developed and well-nourished.  HENT:  Head: Normocephalic.  Eyes: Conjunctivae are normal. No scleral icterus.  Neck: Normal range of motion.  Cardiovascular: Normal rate.   Respiratory: Effort normal. No accessory muscle usage. No tachypnea. No respiratory distress. She has no decreased breath sounds. She has wheezes. She has rhonchi. She has no rales. She exhibits no tenderness.  GI: She exhibits no distension.  Musculoskeletal: Normal range of motion.  Neurological: She is alert and oriented to person, place, and time. Coordination normal.  Skin: Skin is warm and dry.  Psychiatric: She has a normal mood and affect.   Wheezes mostly upper anterior  Trial nebulizer No results found for this or any previous visit (from the past 24 hour(s)).  ASSESSMENT & PLAN  There are no diagnoses linked to this encounter.

## 2016-08-02 ENCOUNTER — Telehealth: Payer: Self-pay | Admitting: Oncology

## 2016-08-02 NOTE — Telephone Encounter (Signed)
Called and spoke to patient on the phone.  She states she has not been in the hospital in the last year.  She has not had any broken bones or fractures.  She is not on any medicine for her bones.  She has not had a bone density test in the last year.  She has not had any cardiac or thrombotic events.  She states that her main problem is very bad achy bones.  She was wondering when she would know what medicine she was on.  Still following patients so we still do not have that answer yet. Will contact her when the study decides to give [place] that information. Remer Macho 08/02/16 - 3:15 pm

## 2017-02-25 ENCOUNTER — Other Ambulatory Visit (INDEPENDENT_AMBULATORY_CARE_PROVIDER_SITE_OTHER): Payer: Self-pay

## 2017-02-25 ENCOUNTER — Ambulatory Visit (INDEPENDENT_AMBULATORY_CARE_PROVIDER_SITE_OTHER): Payer: Medicare Other | Admitting: Orthopaedic Surgery

## 2017-02-25 ENCOUNTER — Ambulatory Visit (INDEPENDENT_AMBULATORY_CARE_PROVIDER_SITE_OTHER): Payer: Medicare Other

## 2017-02-25 DIAGNOSIS — M4316 Spondylolisthesis, lumbar region: Secondary | ICD-10-CM

## 2017-02-25 DIAGNOSIS — G8929 Other chronic pain: Secondary | ICD-10-CM

## 2017-02-25 DIAGNOSIS — M5441 Lumbago with sciatica, right side: Secondary | ICD-10-CM

## 2017-02-25 MED ORDER — MELOXICAM 15 MG PO TABS: 15.0000 mg | ORAL_TABLET | Freq: Every day | ORAL | 1 refills | Status: DC

## 2017-02-25 MED ORDER — METHYLPREDNISOLONE 4 MG PO TABS: ORAL_TABLET | ORAL | 0 refills | Status: DC

## 2017-02-25 NOTE — Progress Notes (Signed)
Office Visit Note   Patient: Sara Wells           Date of Birth: 10-Apr-1941           MRN: [id number] Visit Date: 02/25/2017              Requested by: Haywood Pao, MD [address] [place], [place] [address] PCP: Haywood Pao, MD   Assessment & Plan: Visit Diagnoses:  1. Chronic right-sided low back pain with right-sided sciatica   2. Spondylolisthesis, lumbar region     Plan:Given the degree of spondylolisthesis at L4-L5 combined with her significant neurologic symptoms down her right lower extremity and MRI is warranted to assess the degree of nerve compression to help [place], for the right intervention for her. I will put her on 6 days a steroid taper and some meloxicam and we'll see her back once the MRI is done. All questions were encouraged and answered.  Follow-Up Instructions: Return in about 2 years (around 02/26/2019).   Orders:  Orders Placed This Encounter  Procedures  . XR Lumbar Spine 2-3 Views   No orders of the defined types were placed in this encounter.     Procedures: No procedures performed   Clinical Data: No additional findings.   Subjective: No chief complaint on file. The patient is someone on seeing for the first time as a patient of seen her with other family before. She's had a year's worth of low back pain that radiates down her right leg. She is not a diabetic and she is getting numbness and tingling and weakness throughout her right lower extremity. She's had no change in bowel or bladder function but she starting to have some straining when she goes to the bathroom. She denies any left-sided symptoms. Becoming an aggravating pain. She does not take any medications for this.  HPI  Review of Systems She currently denies any headache, chest pain, shortness of breath, fever, chills, nausea, vomiting.  Objective: Vital Signs: There were no vitals taken for this visit.  Physical Exam She is alert and 3 in no acute  distress Ortho Exam Examination her lumbar spine shows limitations with flexion extension. Extension causes significant amount of pain. She has a positive straight leg raise a left side. She has numbness in the L4 and L5 distribution of her right foot as well. There is slight weakness in EHL. Her left lower extremity exam is normal. Specialty Comments:  No specialty comments available.  Imaging: Xr Lumbar Spine 2-3 Views  Result Date: 02/25/2017 2 views of lumbar spine show a grade 1 spondylolisthesis at L4-L5.    PMFS History: Patient Active Problem List   Diagnosis Date Noted  . Chronic right-sided low back pain with right-sided sciatica 02/25/2017  . Spondylolisthesis, lumbar region 02/25/2017  . Breast cancer ([place]) 10/05/2002   Past Medical History:  Diagnosis Date  . Arthritis    right hand  . Breast cancer ([place]) 10/05/2002   right  . Endometriosis   . GERD (gastroesophageal reflux disease)     Family History  Problem Relation Age of Onset  . Heart disease Brother   . Heart disease Brother   . Heart attack Mother   . Heart attack Father   . Colon cancer Neg Hx     Past Surgical History:  Procedure Laterality Date  . ABDOMINAL HYSTERECTOMY     age 47  . BREAST SURGERY  2004&2009   RIGHT & LEFT  . MOLE REMOVAL  number of years prior to 2004   early melanoma   Social History   Occupational History  . Not on file.   Social History Main Topics  . Smoking status: Never Smoker  . Smokeless tobacco: Never Used  . Alcohol use No  . Drug use: No  . Sexual activity: Not on file

## 2017-03-08 ENCOUNTER — Ambulatory Visit
Admission: RE | Admit: 2017-03-08 | Discharge: 2017-03-08 | Disposition: A | Discharge status: Home / Self Care | Payer: Medicare Other | Source: Ambulatory Visit | Attending: Orthopaedic Surgery | Admitting: Orthopaedic Surgery

## 2017-03-08 DIAGNOSIS — M4316 Spondylolisthesis, lumbar region: Secondary | ICD-10-CM

## 2017-03-12 ENCOUNTER — Other Ambulatory Visit (INDEPENDENT_AMBULATORY_CARE_PROVIDER_SITE_OTHER): Payer: Self-pay

## 2017-03-12 ENCOUNTER — Ambulatory Visit (INDEPENDENT_AMBULATORY_CARE_PROVIDER_SITE_OTHER): Payer: Medicare Other | Admitting: Physician Assistant

## 2017-03-12 DIAGNOSIS — M4316 Spondylolisthesis, lumbar region: Secondary | ICD-10-CM

## 2017-03-12 NOTE — Progress Notes (Signed)
Sara Wells returns today follow-up of her MRI of lumbar spine. She's continues to have low back pain on the right that radiates down the right side in the lateral aspect of the leg in to the ankle but never into the foot. She states the pain in her low back is despite steroid Dosepak and meloxicam.  MRI lumbar spine dated 03/08/2017:FINDINGS DISCUSSED AT LENGTH with actual images from the MRI reviewed. Mild to moderate central canal and bilateral subarticular recess narrowing L3-4. 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 Portion of the protrusion peripherally contacts the left L4 nerve root. Spondylolisthesis has progressed since last MRI. L4-5 bilateral facet degenerative disease without central or foraminal stenosis.  Impression: Low back pain with radicular symptoms right leg.  Plan; We'll send her for epidural steroid injections with Sara Wells. We'll see her back in 4 weeks' check progress lack of after this injection. Questions were encouraged and answered length today.

## 2017-03-26 ENCOUNTER — Encounter (INDEPENDENT_AMBULATORY_CARE_PROVIDER_SITE_OTHER): Payer: Self-pay | Admitting: Physical Medicine and Rehabilitation

## 2017-03-26 ENCOUNTER — Ambulatory Visit (INDEPENDENT_AMBULATORY_CARE_PROVIDER_SITE_OTHER): Payer: Medicare Other

## 2017-03-26 ENCOUNTER — Ambulatory Visit (INDEPENDENT_AMBULATORY_CARE_PROVIDER_SITE_OTHER): Payer: Medicare Other | Admitting: Physical Medicine and Rehabilitation

## 2017-03-26 VITALS — BP 174/90 | HR 66

## 2017-03-26 DIAGNOSIS — M5416 Radiculopathy, lumbar region: Secondary | ICD-10-CM | POA: Diagnosis not present

## 2017-03-26 MED ORDER — LIDOCAINE HCL (PF) 1 % IJ SOLN
2.0000 mL | Freq: Once | INTRAMUSCULAR | Status: AC
  Administered 2017-03-26: 2 mL

## 2017-03-26 MED ORDER — BETAMETHASONE SOD PHOS & ACET 6 (3-3) MG/ML IJ SUSP
12.0000 mg | Freq: Once | INTRAMUSCULAR | Status: AC
  Administered 2017-03-26: 12 mg

## 2017-03-26 NOTE — Progress Notes (Signed)
Pain across lower back and worse on right side.Worse first thing in the morning.  Pain and numbness down right leg to ankle. Leg gives way at time.

## 2017-03-26 NOTE — Patient Instructions (Signed)

## 2017-03-28 NOTE — Procedures (Signed)
Sara Wells is a 76 year old female with chronic worsening severe right sided low back pain worse first thing in the morning with pain and numbness down the right leg to the ankle in an L5 distribution. She reports no focal weakness but feels like her leg will give way at times. She is followed by Dr. Judith Blonder, P.A.-C. They obtain an MRI of the lumbar spine which is fairly impressive for progressive facet arthropathy and listhesis particularly at L4-5 with severe stenosis which is multifactorial but significant for disc extrusion. Interestingly there is more signs of left-sided impact of the disc herniation but clearly she is having right radicular pain. Looking at the MRI images clearly she did have pain on either side. We are going to complete a right L4-L5 transforaminal epidural steroid injection. If she gets decent relief we could consider repeating or watching her and repeat as needed. Hopefully the disc will resorbed to a degree. Unfortunately she may be a surgical consideration.  Lumbosacral Transforaminal Epidural Steroid Injection - Sub-Pedicular Approach with Fluoroscopic Guidance  Patient: Sara Wells      Date of Birth: 1941/07/21 MRN: [id number] PCP: Haywood Pao, MD      Visit Date: 03/26/2017   Universal Protocol:    Date/Time: 03/26/2017  Consent Given By: the patient  Position: PRONE  Additional Comments: Vital signs were monitored before and after the procedure. Patient was prepped and draped in the usual sterile fashion. The correct patient, procedure, and site was verified.   Injection Procedure Details:  Procedure Site One Meds Administered:  Meds ordered this encounter  Medications  . lidocaine (PF) (XYLOCAINE) 1 % injection 2 mL  . betamethasone acetate-betamethasone sodium phosphate (CELESTONE) injection 12 mg    Laterality: Right  Location/Site:  L4-L5 L5-S1  Needle size: 22 G  Needle type: Spinal  Needle Placement:  Transforaminal  Findings:  -Contrast Used: 1 mL iohexol 180 mg iodine/mL   -Comments: Excellent flow of contrast along the nerve and into the epidural space.  Procedure Details: After squaring off the end-plates to get a true AP view, the C-arm was positioned so that an oblique view of the foramen as noted above was visualized. The target area is just inferior to the "nose of the scotty dog" or sub pedicular. The soft tissues overlying this structure were infiltrated with 2-3 ml. of 1% Lidocaine without Epinephrine.  The spinal needle was inserted toward the target using a "trajectory" view along the fluoroscope beam.  Under AP and lateral visualization, the needle was advanced so it did not puncture dura and was located close the 6 O'Clock position of the pedical in AP tracterory. Biplanar projections were used to confirm position. Aspiration was confirmed to be negative for CSF and/or blood. A 1-2 ml. volume of Isovue-250 was injected and flow of contrast was noted at each level. Radiographs were obtained for documentation purposes.   After attaining the desired flow of contrast documented above, a 0.5 to 1.0 ml test dose of 0.25% Marcaine was injected into each respective transforaminal space.  The patient was observed for 90 seconds post injection.  After no sensory deficits were reported, and normal lower extremity motor function was noted,   the above injectate was administered so that equal amounts of the injectate were placed at each foramen (level) into the transforaminal epidural space.   Additional Comments:  The patient tolerated the procedure well Dressing: Band-Aid    Post-procedure details: Patient was observed during the procedure. Post-procedure instructions were  reviewed.  Patient left the clinic in stable condition.

## 2017-04-10 ENCOUNTER — Ambulatory Visit (INDEPENDENT_AMBULATORY_CARE_PROVIDER_SITE_OTHER): Payer: Medicare Other | Admitting: Physician Assistant

## 2017-04-10 ENCOUNTER — Ambulatory Visit (INDEPENDENT_AMBULATORY_CARE_PROVIDER_SITE_OTHER): Payer: Medicare Other | Admitting: Orthopaedic Surgery

## 2017-04-10 DIAGNOSIS — G8929 Other chronic pain: Secondary | ICD-10-CM

## 2017-04-10 DIAGNOSIS — M5441 Lumbago with sciatica, right side: Secondary | ICD-10-CM

## 2017-04-10 NOTE — Progress Notes (Signed)
Mrs. Sara Wells is return for follow-up after having an L4-5 and L5-S1 injection by Dr. Ernestina Patches. This was a transforaminal injection. She said she did great for the first week or 2 but did get numbness in her leg on the right side and she is shopping last week. She said is manageable but she is carrying a cane sometimes just in case she needs it. She does take meloxicam without a regular basis. She has not had any physical therapy.  On exam she has excellent strength in both lower extremities. She has slight radicular symptoms down the back of her knee on the right side but not left said. She has excellent strength and normal reflexes in both her legs.  I do feel would be appropriate to try outpatient physical therapy to work on varus modalities to decrease her sciatic pain. They can work on home exercise program as well. She is definitely in favor of trying something like this. She may end up benefiting from a second transforaminal injection when I see her back after course of therapy. All questions were encouraged and answered. We'll work on getting the surgery schedule. I'll see her back myself in 4 weeks

## 2017-04-11 ENCOUNTER — Other Ambulatory Visit (INDEPENDENT_AMBULATORY_CARE_PROVIDER_SITE_OTHER): Payer: Self-pay

## 2017-04-11 DIAGNOSIS — M5441 Lumbago with sciatica, right side: Principal | ICD-10-CM

## 2017-04-11 DIAGNOSIS — G8929 Other chronic pain: Secondary | ICD-10-CM

## 2017-04-22 ENCOUNTER — Encounter: Payer: Self-pay | Admitting: Physical Therapy

## 2017-04-22 ENCOUNTER — Ambulatory Visit: Payer: Medicare Other | Attending: Orthopaedic Surgery | Admitting: Physical Therapy

## 2017-04-22 DIAGNOSIS — M5441 Lumbago with sciatica, right side: Secondary | ICD-10-CM

## 2017-04-22 DIAGNOSIS — M62830 Muscle spasm of back: Secondary | ICD-10-CM | POA: Diagnosis present

## 2017-04-22 NOTE — Therapy (Signed)
[place], [place], [address] Phone: [phone]   Fax:  [phone]  Physical Therapy Treatment  Patient Details  Name: Sara Wells MRN: [id number] Date of Birth: Aug 25, 1940 Referring Provider: Ninfa Linden  Encounter Date: 04/22/2017      PT End of Session - 04/22/17 1510    Visit Number 1   Date for PT Re-Evaluation 06/22/17   PT Start Time 9381   PT Stop Time 1530   PT Time Calculation (min) 66 min   Activity Tolerance Patient tolerated treatment well   Behavior During Therapy [facility] for tasks assessed/performed      Past Medical History:  Diagnosis Date  . Arthritis    right hand  . Breast cancer ([place]) 10/05/2002   right  . Endometriosis   . GERD (gastroesophageal reflux disease)     Past Surgical History:  Procedure Laterality Date  . ABDOMINAL HYSTERECTOMY     age 23  . BREAST SURGERY  2004&2009   RIGHT & LEFT  . MOLE REMOVAL  number of years prior to 2004   early melanoma    There were no vitals filed for this visit.      Subjective Assessment - 04/22/17 1429    Subjective Patient reports severe pain for about 8 months.  Recent MRI showed DDD, spondylosis and a disc protrusion.  REports that over the past 8 months the pain has goten worse, she reports difficulty with standing and cooking, She reports that she has been having right leg pain and numbness.   Limitations Lifting;Sitting;Walking;House hold activities   How Kalisz can you sit comfortably? 5 minutes   How Ullom can you stand comfortably? 30-45 minutes   Patient Stated Goals have less pain, less difficulty with ADL's   Currently in Pain? Yes   Pain Score 5    Pain Location Back   Pain Orientation Right;Lower   Pain Descriptors / Indicators Aching;Sore;Spasm   Pain Type Acute pain   Pain Radiating Towards pain in the right buttock, posterior right thigh to the calf.     Pain Onset More than a month ago   Pain  Frequency Constant   Aggravating Factors  sitting, standing, trying took, pain will be a 10/10   Pain Relieving Factors change positions, fetal position, leaning to the left, pain can be 2/10   Effect of Pain on Daily Activities difficulty with all ADL's            [facility] PT Assessment - 04/22/17 0001      Assessment   Medical Diagnosis LBP with right sciatica   Referring Provider Ninfa Linden   Onset Date/Surgical Date 03/22/17   Prior Therapy no     Precautions   Precautions None     Balance Screen   Has the patient fallen in the past 6 months No   Has the patient had a decrease in activity level because of a fear of falling?  No   Is the patient reluctant to leave their home because of a fear of falling?  No     Home Environment   Additional Comments does cooking, cleaning, some yardwork, loves to garden     Prior Function   Level of Independence Independent   Vocation Retired   Leisure no exercise     Posture/Postural Control   Posture Comments slouched sitting posture, fwd head, rounded shoulders     ROM / Strength   AROM / PROM /  Strength AROM;Strength     AROM   Overall AROM Comments Lumbar ROM decreased 25% for extension, pain in the right buttock and the HS area     Strength   Overall Strength Comments 4/5 with some pain in the right buttock, c/o cramping in the legs HS and calves many times a week     Flexibility   Soft Tissue Assessment /Muscle Length --  tight HS, calves and piriformis mms     Palpation   Palpation comment very tight and tender in the low back, the buttocks, spasms in the lumbar area                     [facility] Adult PT Treatment/Exercise - 04/22/17 0001      Modalities   Modalities Cryotherapy;Electrical Stimulation     Cryotherapy   Number Minutes Cryotherapy 15 Minutes   Cryotherapy Location Lumbar Spine;Hip   Type of Cryotherapy Ice pack     Electrical Stimulation   Electrical Stimulation Location Right buttock    Electrical Stimulation Action IFC   Electrical Stimulation Parameters supine   Electrical Stimulation Goals Pain                PT Education - 04/22/17 1510    Education provided Yes   Education Details Wms flexion, shhet traction   Person(s) Educated Patient   Methods Explanation;Demonstration;Handout   Comprehension Verbalized understanding          PT Short Term Goals - 04/22/17 1514      PT SHORT TERM GOAL #1   Title independent with initial HEP   Time 2   Period Weeks   Status New           PT Henkes Term Goals - 04/22/17 1559      PT Demo TERM GOAL #1   Title understand proper posture and body mechanics   Time 8   Period Weeks   Status New     PT Bolte TERM GOAL #2   Title increase lumbar ROM 25%   Time 8   Period Weeks   Status New     PT Belkin TERM GOAL #3   Title decrease pain 50%   Time 8   Period Weeks   Status New     PT Najarro TERM GOAL #4   Title tolerate standing to do dishes without difficulty   Time 8   Period Weeks   Status New               Plan - 04/22/17 1511    Clinical Impression Statement Patient reports 8 months of worsening LBP with sciaitca type symptoms into the right buttock and the right LE.  She is very tight in the HS and the calf, she has antalgic gait on the right, she had MRI that shows significant spondylosis and L4-5 nerve root impingement,  She reports ice helps, cannot stand Shetterly, cannot sit Mehring, leans away from the right side with sitting   Clinical Presentation Evolving   Clinical Presentation due to: symptoms changing   Clinical Decision Making Low   Rehab Potential Good   PT Frequency 1x / week   PT Duration 8 weeks   PT Treatment/Interventions Cryotherapy;Electrical Stimulation;Moist Heat;Traction;Ultrasound;Stair training;Patient/family education;Therapeutic exercise;Therapeutic activities;Manual techniques   PT Next Visit Plan Patient reports a $40 copay, wants to limit visits   Consulted and  Agree with Plan of Care Patient      Patient will benefit from skilled  therapeutic intervention in order to improve the following deficits and impairments:  Abnormal gait, Decreased activity tolerance, Decreased mobility, Postural dysfunction, Impaired flexibility, Improper body mechanics, Pain, Increased muscle spasms, Decreased range of motion, Difficulty walking  Visit Diagnosis: Acute right-sided low back pain with right-sided sciatica - Plan: PT plan of care cert/re-cert  Muscle spasm of back - Plan: PT plan of care cert/re-cert       G-Codes - 2017-04-27 1611    Functional Assessment Tool Used (Outpatient Only) foto 51% limitation   Functional Limitation Other PT primary   Other PT Primary Current Status (T6226) At least 40 percent but less than 60 percent impaired, limited or restricted   Other PT Primary Goal Status (J3354) At least 40 percent but less than 60 percent impaired, limited or restricted      Problem List Patient Active Problem List   Diagnosis Date Noted  . Chronic right-sided low back pain with right-sided sciatica 02/25/2017  . Spondylolisthesis, lumbar region 02/25/2017  . Breast cancer ([place]) 10/05/2002    Sumner Boast., PT 04/27/2017, 4:15 PM  [place], [place], [address] Phone: [phone]   Fax:  [phone]  Name: Sara Wells MRN: [id number] Date of Birth: 1940/08/12

## 2017-04-30 ENCOUNTER — Ambulatory Visit: Payer: Medicare Other | Admitting: Physical Therapy

## 2017-04-30 ENCOUNTER — Encounter: Payer: Self-pay | Admitting: Physical Therapy

## 2017-04-30 DIAGNOSIS — M62830 Muscle spasm of back: Secondary | ICD-10-CM

## 2017-04-30 DIAGNOSIS — M5441 Lumbago with sciatica, right side: Secondary | ICD-10-CM | POA: Diagnosis not present

## 2017-04-30 NOTE — Therapy (Signed)
[place], [place], [address] Phone: [phone]   Fax:  [phone]  Physical Therapy Treatment  Patient Details  Name: Sara Wells MRN: [id number] Date of Birth: 1941/02/24 Referring Provider: Ninfa Linden  Encounter Date: 04/30/2017      PT End of Session - 04/30/17 1600    Visit Number 2   Date for PT Re-Evaluation 06/22/17   PT Start Time 1510   PT Stop Time 7062   PT Time Calculation (min) 64 min   Activity Tolerance Patient tolerated treatment well   Behavior During Therapy [facility] for tasks assessed/performed      Past Medical History:  Diagnosis Date  . Arthritis    right hand  . Breast cancer ([place]) 10/05/2002   right  . Endometriosis   . GERD (gastroesophageal reflux disease)     Past Surgical History:  Procedure Laterality Date  . ABDOMINAL HYSTERECTOMY     age 19  . BREAST SURGERY  2004&2009   RIGHT & LEFT  . MOLE REMOVAL  number of years prior to 2004   early melanoma    There were no vitals filed for this visit.      Subjective Assessment - 04/30/17 1509    Subjective "I am walking a little better" Pt reports that both of her legs are going numb with prolong standing and walking   Currently in Pain? No/denies   Pain Score 0-No pain                         [facility] Adult PT Treatment/Exercise - 04/30/17 0001      Exercises   Exercises Lumbar     Lumbar Exercises: Stretches   Passive Hamstring Stretch 4 reps;10 seconds   Piriformis Stretch 3 reps;10 seconds     Lumbar Exercises: Aerobic   Elliptical NuStep L4 x6 min     Lumbar Exercises: Machines for Strengthening   Cybex Knee Extension 5lb 2x10    Cybex Knee Flexion 25lb 2x10      Lumbar Exercises: Standing   Row 10 reps;Both;Theraband  x2   Theraband Level (Row) Level 2 (Red)   Shoulder Extension 10 reps;Theraband;Both  x2   Theraband Level (Shoulder Extension) Level 2 (Red)     Lumbar  Exercises: Seated   Other Seated Lumbar Exercises Physo ball crnches  3'' x10     Lumbar Exercises: Supine   Bridge 10 reps;2 seconds;Compliant  x2   Straight Leg Raise 10 reps;2 seconds   Other Supine Lumbar Exercises LE on physo bridges,K2C, rotations     Modalities   Modalities Cryotherapy;Electrical Stimulation     Cryotherapy   Number Minutes Cryotherapy 15 Minutes   Cryotherapy Location Lumbar Spine;Hip   Type of Cryotherapy Ice pack     Electrical Stimulation   Electrical Stimulation Location Right buttock   Electrical Stimulation Action IFC   Electrical Stimulation Parameters supine   Electrical Stimulation Goals Pain                  PT Short Term Goals - 04/22/17 1514      PT SHORT TERM GOAL #1   Title independent with initial HEP   Time 2   Period Weeks   Status New           PT Bellard Term Goals - 04/22/17 1559      PT Ducksworth TERM GOAL #1   Title understand proper  posture and body mechanics   Time 8   Period Weeks   Status New     PT Cadmus TERM GOAL #2   Title increase lumbar ROM 25%   Time 8   Period Weeks   Status New     PT Treto TERM GOAL #3   Title decrease pain 50%   Time 8   Period Weeks   Status New     PT Duca TERM GOAL #4   Title tolerate standing to do dishes without difficulty   Time 8   Period Weeks   Status New               Plan - 04/30/17 1601    Clinical Impression Statement Pt tolerated an initial progression to exercises well. She does reports some calve cramping in LLE with supine bridges. All other exercises performed with good strength and ROM. Pt voiced concerned about getting off up from the floor. Worked on some transfers from supine to kneeling to standing on mat table requiring min assist.   Rehab Potential Good   PT Frequency 1x / week   PT Duration 8 weeks   PT Treatment/Interventions Cryotherapy;Electrical Stimulation;Moist Heat;Traction;Ultrasound;Stair training;Patient/family  education;Therapeutic exercise;Therapeutic activities;Manual techniques   PT Next Visit Plan Patient reports a $40 copay, wants to limit visits, ptogress as toleraed, continues floor transfer work      Patient will benefit from skilled therapeutic intervention in order to improve the following deficits and impairments:  Abnormal gait, Decreased activity tolerance, Decreased mobility, Postural dysfunction, Impaired flexibility, Improper body mechanics, Pain, Increased muscle spasms, Decreased range of motion, Difficulty walking  Visit Diagnosis: Acute right-sided low back pain with right-sided sciatica  Muscle spasm of back     Problem List Patient Active Problem List   Diagnosis Date Noted  . Chronic right-sided low back pain with right-sided sciatica 02/25/2017  . Spondylolisthesis, lumbar region 02/25/2017  . Breast cancer ([place]) 10/05/2002    Scot Jun 04/30/2017, 4:03 PM  [place], [place], [address] Phone: [phone]   Fax:  [phone]  Name: Sara Wells MRN: [id number] Date of Birth: 11-Jul-1941

## 2017-05-03 ENCOUNTER — Encounter: Payer: Self-pay | Admitting: Physical Therapy

## 2017-05-03 ENCOUNTER — Ambulatory Visit: Payer: Medicare Other | Admitting: Physical Therapy

## 2017-05-03 DIAGNOSIS — M5441 Lumbago with sciatica, right side: Secondary | ICD-10-CM | POA: Diagnosis not present

## 2017-05-03 DIAGNOSIS — M62830 Muscle spasm of back: Secondary | ICD-10-CM

## 2017-05-03 NOTE — Therapy (Signed)
[place], [place], [address] Phone: [phone]   Fax:  [phone]  Physical Therapy Treatment  Patient Details  Name: WILLER OSORNO MRN: [id number] Date of Birth: June 10, 1941 Referring Provider: Ninfa Linden  Encounter Date: 05/03/2017      PT End of Session - 05/03/17 1142    Visit Number 3   Date for PT Re-Evaluation 06/22/17   PT Start Time 1100   PT Stop Time 1146   PT Time Calculation (min) 46 min   Activity Tolerance Patient tolerated treatment well   Behavior During Therapy [facility] for tasks assessed/performed      Past Medical History:  Diagnosis Date  . Arthritis    right hand  . Breast cancer ([place]) 10/05/2002   right  . Endometriosis   . GERD (gastroesophageal reflux disease)     Past Surgical History:  Procedure Laterality Date  . ABDOMINAL HYSTERECTOMY     age 76  . BREAST SURGERY  2004&2009   RIGHT & LEFT  . MOLE REMOVAL  number of years prior to 2004   early melanoma    There were no vitals filed for this visit.      Subjective Assessment - 05/03/17 1058    Subjective Patient c/o numbness in the legs when standing or walking.  She reports some soreness in the legs   Currently in Pain? No/denies                         [facility] Adult PT Treatment/Exercise - 05/03/17 0001      Lumbar Exercises: Stretches   Passive Hamstring Stretch 4 reps;20 seconds   Piriformis Stretch 4 reps;20 seconds     Lumbar Exercises: Aerobic   Elliptical NuStep L4 x6 min     Lumbar Exercises: Machines for Strengthening   Cybex Knee Extension 5lb 2x10    Cybex Knee Flexion 25lb 2x10      Lumbar Exercises: Standing   Row 10 reps;Both;Theraband   Theraband Level (Row) Level 2 (Red)   Shoulder Extension 10 reps;Theraband;Both   Theraband Level (Shoulder Extension) Level 2 (Red)   Other Standing Lumbar Exercises hip extension and abduction 2.5# 2x10 each     Lumbar  Exercises: Supine   Other Supine Lumbar Exercises LE on physo bridges,K2C, rotations, abdominal isometrics   Other Supine Lumbar Exercises small trunk rotations with tband resistance     Manual Therapy   Manual Therapy Soft tissue mobilization   Soft tissue mobilization use of vibration on the calves and HS as she was c/o cramps                  PT Short Term Goals - 04/30/17 1603      PT SHORT TERM GOAL #1   Title independent with initial HEP   Status Achieved           PT Formanek Term Goals - 04/22/17 1559      PT Paolini TERM GOAL #1   Title understand proper posture and body mechanics   Time 8   Period Weeks   Status New     PT Grenier TERM GOAL #2   Title increase lumbar ROM 25%   Time 8   Period Weeks   Status New     PT Medearis TERM GOAL #3   Title decrease pain 50%   Time 8   Period Weeks   Status New  PT Terriquez TERM GOAL #4   Title tolerate standing to do dishes without difficulty   Time 8   Period Weeks   Status New               Plan - 05/03/17 1142    Clinical Impression Statement Patient with tight HS and calves, had some cramping with activities due to difficulty relaxing.  Did well with exercises, some cues needed for posture and form   PT Next Visit Plan progress as tolerated   Consulted and Agree with Plan of Care Patient      Patient will benefit from skilled therapeutic intervention in order to improve the following deficits and impairments:  Abnormal gait, Decreased activity tolerance, Decreased mobility, Postural dysfunction, Impaired flexibility, Improper body mechanics, Pain, Increased muscle spasms, Decreased range of motion, Difficulty walking  Visit Diagnosis: Acute right-sided low back pain with right-sided sciatica  Muscle spasm of back     Problem List Patient Active Problem List   Diagnosis Date Noted  . Chronic right-sided low back pain with right-sided sciatica 02/25/2017  . Spondylolisthesis, lumbar region  02/25/2017  . Breast cancer ([place]) 10/05/2002    Sumner Boast., PT 05/03/2017, 11:43 AM  [place], [place], [address] Phone: [phone]   Fax:  [phone]  Name: SHILYNN HOCH MRN: [id number] Date of Birth: 1941-02-01

## 2017-05-07 ENCOUNTER — Encounter: Payer: Self-pay | Admitting: Physical Therapy

## 2017-05-07 ENCOUNTER — Ambulatory Visit: Payer: Medicare Other | Attending: Orthopaedic Surgery | Admitting: Physical Therapy

## 2017-05-07 DIAGNOSIS — M62830 Muscle spasm of back: Secondary | ICD-10-CM | POA: Insufficient documentation

## 2017-05-07 DIAGNOSIS — M5441 Lumbago with sciatica, right side: Secondary | ICD-10-CM | POA: Diagnosis present

## 2017-05-07 NOTE — Therapy (Signed)
[place], [place], [address] Phone: [phone]   Fax:  [phone]  Physical Therapy Treatment  Patient Details  Name: Sara Wells MRN: [id number] Date of Birth: Feb 26, 1941 Referring Provider: Ninfa Linden  Encounter Date: 05/07/2017      PT End of Session - 05/07/17 1345    Visit Number 4   Date for PT Re-Evaluation 06/22/17   PT Start Time 1301   PT Stop Time 1359   PT Time Calculation (min) 58 min   Activity Tolerance Patient tolerated treatment well   Behavior During Therapy [facility] for tasks assessed/performed      Past Medical History:  Diagnosis Date  . Arthritis    right hand  . Breast cancer ([place]) 10/05/2002   right  . Endometriosis   . GERD (gastroesophageal reflux disease)     Past Surgical History:  Procedure Laterality Date  . ABDOMINAL HYSTERECTOMY     age 58  . BREAST SURGERY  2004&2009   RIGHT & LEFT  . MOLE REMOVAL  number of years prior to 2004   early melanoma    There were no vitals filed for this visit.      Subjective Assessment - 05/07/17 1300    Subjective Pt reports that she was standing in the yard yesterday and her R legs went completely numb.    Currently in Pain? No/denies   Pain Score 0-No pain                         [facility] Adult PT Treatment/Exercise - 05/07/17 0001      Lumbar Exercises: Stretches   Passive Hamstring Stretch 4 reps;20 seconds   Piriformis Stretch 4 reps;20 seconds     Lumbar Exercises: Aerobic   Elliptical NuStep L4 x6 min     Lumbar Exercises: Machines for Strengthening   Cybex Knee Extension 5lb 2x10    Cybex Knee Flexion 25lb 2x10    Leg Press 30lb 2x10   Other Lumbar Machine Exercise rows and lats 20lb 2x10      Lumbar Exercises: Supine   Other Supine Lumbar Exercises LE on physo bridges,K2C, rotations, abdominal isometrics     Modalities   Modalities Electrical Stimulation;Moist Heat     Moist  Heat Therapy   Number Minutes Moist Heat 15 Minutes   Moist Heat Location Lumbar Spine     Electrical Stimulation   Electrical Stimulation Location Right buttock   Electrical Stimulation Action IFC   Electrical Stimulation Parameters supine   Electrical Stimulation Goals Pain                  PT Short Term Goals - 04/30/17 1603      PT SHORT TERM GOAL #1   Title independent with initial HEP   Status Achieved           PT Amacher Term Goals - 05/07/17 1347      PT Javid TERM GOAL #1   Title understand proper posture and body mechanics   Status Partially Met     PT Rashad TERM GOAL #3   Title decrease pain 50%   Status Partially Met               Plan - 05/07/17 1346    Clinical Impression Statement No numbness reported with today's exercises. Progressed to more machine level interventions. Does reports some mild cramping in L HS with supine exercises.  Bilat HS tightness R>L.   Rehab Potential Good   PT Frequency 1x / week   PT Duration 8 weeks   PT Treatment/Interventions Cryotherapy;Electrical Stimulation;Moist Heat;Traction;Ultrasound;Stair training;Patient/family education;Therapeutic exercise;Therapeutic activities;Manual techniques   PT Next Visit Plan progress as tolerated      Patient will benefit from skilled therapeutic intervention in order to improve the following deficits and impairments:  Abnormal gait, Decreased activity tolerance, Decreased mobility, Postural dysfunction, Impaired flexibility, Improper body mechanics, Pain, Increased muscle spasms, Decreased range of motion, Difficulty walking  Visit Diagnosis: Muscle spasm of back  Acute right-sided low back pain with right-sided sciatica     Problem List Patient Active Problem List   Diagnosis Date Noted  . Chronic right-sided low back pain with right-sided sciatica 02/25/2017  . Spondylolisthesis, lumbar region 02/25/2017  . Breast cancer ([place]) 10/05/2002    Scot Jun 05/07/2017, 1:48 PM  [place], [place], [address] Phone: [phone]   Fax:  [phone]  Name: Sara Wells MRN: [id number] Date of Birth: Sep 12, 1940

## 2017-05-09 ENCOUNTER — Other Ambulatory Visit (INDEPENDENT_AMBULATORY_CARE_PROVIDER_SITE_OTHER): Payer: Self-pay | Admitting: Orthopaedic Surgery

## 2017-05-09 ENCOUNTER — Other Ambulatory Visit (INDEPENDENT_AMBULATORY_CARE_PROVIDER_SITE_OTHER): Payer: Self-pay

## 2017-05-09 ENCOUNTER — Ambulatory Visit (INDEPENDENT_AMBULATORY_CARE_PROVIDER_SITE_OTHER): Payer: Medicare Other | Admitting: Orthopaedic Surgery

## 2017-05-09 DIAGNOSIS — M5442 Lumbago with sciatica, left side: Principal | ICD-10-CM

## 2017-05-09 DIAGNOSIS — M4316 Spondylolisthesis, lumbar region: Secondary | ICD-10-CM

## 2017-05-09 DIAGNOSIS — G8929 Other chronic pain: Secondary | ICD-10-CM

## 2017-05-09 MED ORDER — MELOXICAM 7.5 MG PO TABS: 7.5000 mg | ORAL_TABLET | Freq: Two times a day (BID) | ORAL | 3 refills | Status: DC | PRN

## 2017-05-09 NOTE — Progress Notes (Signed)
Sara Wells has now been to physical therapy on her lumbar spine and she said that is helped. She still has though significant radicular symptoms going down her right backside to her knee and down her foot. 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 She had an intervention by Dr. Ernestina Wells in her lumbar spine earlier this year. At this point she would like to try at least 1 more intervention with him. She denies any weakness in her legs but this is mainly just the pain radicular symptoms. She still take meloxicam. She is denying any change in bowel or bladder function. She does not walk with assistive device. He  On exam she still has excellent strength in her bilateral lower extremities and most her pain seems to be at the right sacral area but it does radiate in the sciatic area and to behind her knee. She has tight hamstrings well but overall she does appear better mobility wise with her physical therapy.  At this point it is definitely worth having another intervention in her lumbar spine to the right side likely L4-L5 by Dr. Ernestina Wells and we will work on getting that scheduled. I'll see her back myself in about 6 weeks.

## 2017-05-10 ENCOUNTER — Encounter: Payer: Self-pay | Admitting: Physical Therapy

## 2017-05-10 ENCOUNTER — Ambulatory Visit: Payer: Medicare Other | Admitting: Physical Therapy

## 2017-05-10 DIAGNOSIS — M62830 Muscle spasm of back: Secondary | ICD-10-CM

## 2017-05-10 DIAGNOSIS — M5441 Lumbago with sciatica, right side: Secondary | ICD-10-CM

## 2017-05-10 NOTE — Therapy (Signed)
[place], [place], [address] Phone: [phone]   Fax:  [phone]  Physical Therapy Treatment  Patient Details  Name: Sara Wells MRN: [id number] Date of Birth: 09/26/1940 Referring Provider: Ninfa Linden  Encounter Date: 05/10/2017      PT End of Session - 05/10/17 1018    Visit Number 5   Date for PT Re-Evaluation 06/22/17   PT Start Time 0930   PT Stop Time 1025   PT Time Calculation (min) 55 min   Activity Tolerance Patient tolerated treatment well   Behavior During Therapy [facility] for tasks assessed/performed      Past Medical History:  Diagnosis Date  . Arthritis    right hand  . Breast cancer ([place]) 10/05/2002   right  . Endometriosis   . GERD (gastroesophageal reflux disease)     Past Surgical History:  Procedure Laterality Date  . ABDOMINAL HYSTERECTOMY     age 76  . BREAST SURGERY  2004&2009   RIGHT & LEFT  . MOLE REMOVAL  number of years prior to 2004   early melanoma    There were no vitals filed for this visit.      Subjective Assessment - 05/10/17 0931    Subjective Pt reports that it is hard to walk in the morning due to her butt muscles being sore. "Im all right"   Currently in Pain? No/denies   Pain Score 0-No pain                         [facility] Adult PT Treatment/Exercise - 05/10/17 0001      Ambulation/Gait   Gait Comments walked around building ~1/10 mile up and down slope. pt reports some HS pain and tightness L>R. decrease gait speed with up hill amb     Lumbar Exercises: Stretches   Passive Hamstring Stretch 4 reps;20 seconds     Lumbar Exercises: Aerobic   Elliptical NuStep L4 x7 min     Lumbar Exercises: Machines for Strengthening   Cybex Knee Extension 10lb 2x10   Cybex Knee Flexion 25lb 2x10    Leg Press 40lb 2x10     Lumbar Exercises: Standing   Other Standing Lumbar Exercises Lateral step ups x10 each 6 in      Lumbar  Exercises: Supine   Bridge 10 reps;2 seconds;Compliant  x2   Straight Leg Raise 10 reps;2 seconds   Other Supine Lumbar Exercises small trunk rotations with tband resistance     Moist Heat Therapy   Number Minutes Moist Heat 10 Minutes   Moist Heat Location Other (comment)  L thigh     Manual Therapy   Manual Therapy Soft tissue mobilization;Passive ROM   Soft tissue mobilization L medial HS   Passive ROM L hip flex,abd,IR, and ER                  PT Short Term Goals - 04/30/17 1603      PT SHORT TERM GOAL #1   Title independent with initial HEP   Status Achieved           PT [place] Term Goals - 05/10/17 0934      PT Torelli TERM GOAL #4   Title tolerate standing to do dishes without difficulty   Status Partially Met               Plan - 05/10/17 1020    Clinical  Impression Statement Pt with some medial L HS tightness after outdoor ambulation. Hs tender to the touch noted with palpation. All other exercises completed well.   Rehab Potential Good   PT Frequency 1x / week   PT Duration 8 weeks   PT Treatment/Interventions Cryotherapy;Electrical Stimulation;Moist Heat;Traction;Ultrasound;Stair training;Patient/family education;Therapeutic exercise;Therapeutic activities;Manual techniques   PT Next Visit Plan progress as tolerated      Patient will benefit from skilled therapeutic intervention in order to improve the following deficits and impairments:  Abnormal gait, Decreased activity tolerance, Decreased mobility, Postural dysfunction, Impaired flexibility, Improper body mechanics, Pain, Increased muscle spasms, Decreased range of motion, Difficulty walking  Visit Diagnosis: Acute right-sided low back pain with right-sided sciatica  Muscle spasm of back     Problem List Patient Active Problem List   Diagnosis Date Noted  . Chronic right-sided low back pain with right-sided sciatica 02/25/2017  . Spondylolisthesis, lumbar region 02/25/2017  .  Breast cancer ([place]) 10/05/2002    Scot Jun, PTA 05/10/2017, 10:21 AM  [place], [place], [address] Phone: [phone]   Fax:  [phone]  Name: Sara Wells MRN: [id number] Date of Birth: [birth year]

## 2017-05-14 ENCOUNTER — Ambulatory Visit: Payer: Medicare Other | Admitting: Physical Therapy

## 2017-05-14 DIAGNOSIS — M62830 Muscle spasm of back: Secondary | ICD-10-CM

## 2017-05-14 DIAGNOSIS — M5441 Lumbago with sciatica, right side: Secondary | ICD-10-CM

## 2017-05-14 NOTE — Therapy (Signed)
[place], [place], [address] Phone: [phone]   Fax:  [phone]  Physical Therapy Treatment  Patient Details  Name: Sara Wells MRN: [id number] Date of Birth: [birth year] Referring Provider: Ninfa Linden  Encounter Date: 05/14/2017      PT End of Session - 05/14/17 1431    Visit Number 6   Date for PT Re-Evaluation 06/22/17   PT Start Time (P)  1355   PT Stop Time (P)  1450   PT Time Calculation (min) (P)  55 min      Past Medical History:  Diagnosis Date  . Arthritis    right hand  . Breast cancer ([place]) 10/05/2002   right  . Endometriosis   . GERD (gastroesophageal reflux disease)     Past Surgical History:  Procedure Laterality Date  . ABDOMINAL HYSTERECTOMY     age 14  . BREAST SURGERY  2004&2009   RIGHT & LEFT  . MOLE REMOVAL  number of years prior to 2004   early melanoma    There were no vitals filed for this visit.      Subjective Assessment - 05/14/17 1354    Subjective doing okay,legs still going numb. Seeing MD for injection tomorrow.   Currently in Pain? Yes   Pain Score 5    Pain Location Back            [facility] PT Assessment - 05/14/17 0001      AROM   Overall AROM Comments decreased 25%                     [facility] Adult PT Treatment/Exercise - 05/14/17 0001      Lumbar Exercises: Aerobic   Elliptical L 5 6 min Nustep     Lumbar Exercises: Machines for Strengthening   Cybex Lumbar Extension black tband 2 sets 10   Cybex Knee Extension 10lb 2x10   Cybex Knee Flexion 25lb 2x10    Leg Press 40# 3 sets 10  calf raises 40# 2 sets 10   Other Lumbar Machine Exercise rows and lats 20lb 2x10      Lumbar Exercises: Standing   Other Standing Lumbar Exercises pulleys 5# 10 reps hip flex,ext and abd   Other Standing Lumbar Exercises wt ball OH ext and obl twist 10 each     Modalities   Modalities Traction     Moist Heat Therapy   Number  Minutes Moist Heat 15 Minutes   Moist Heat Location Lumbar Spine     Traction   Type of Traction Lumbar   Max (lbs) 50   Time 15                PT Education - 05/14/17 1430    Education provided Yes   Education Details educ on trying ext with numbness to see if symptoms decrease   Person(s) Educated Patient   Methods Explanation;Demonstration   Comprehension Verbalized understanding;Returned demonstration          PT Short Term Goals - 04/30/17 1603      PT SHORT TERM GOAL #1   Title independent with initial HEP   Status Achieved           PT [place] Term Goals - 05/14/17 1430      PT Wince TERM GOAL #1   Title understand proper posture and body mechanics   Status Partially Met     PT Druckenmiller TERM  GOAL #2   Title increase lumbar ROM 25%   Status Partially Met     PT Dolin TERM GOAL #3   Title decrease pain 50%   Status Partially Met     PT Lourenco TERM GOAL #4   Title tolerate standing to do dishes without difficulty   Status Not Met               Plan - 05/14/17 1449    Clinical Impression Statement pt tolerated ther ex well but some cuing needed for postural control of ex. pt reported " I feel great" after traction. Progressing with goals.   PT Treatment/Interventions Cryotherapy;Electrical Stimulation;Moist Heat;Traction;Ultrasound;Stair training;Patient/family education;Therapeutic exercise;Therapeutic activities;Manual techniques   PT Next Visit Plan spine inj 10/18. Assess traction and progress      Patient will benefit from skilled therapeutic intervention in order to improve the following deficits and impairments:  Abnormal gait, Decreased activity tolerance, Decreased mobility, Postural dysfunction, Impaired flexibility, Improper body mechanics, Pain, Increased muscle spasms, Decreased range of motion, Difficulty walking  Visit Diagnosis: Acute right-sided low back pain with right-sided sciatica  Muscle spasm of back     Problem  List Patient Active Problem List   Diagnosis Date Noted  . Chronic right-sided low back pain with right-sided sciatica 02/25/2017  . Spondylolisthesis, lumbar region 02/25/2017  . Breast cancer ([place]) 10/05/2002    PAYSEUR,ANGIE PTA 05/14/2017, 2:51 PM  [place], [place], [address] Phone: [phone]   Fax:  [phone]  Name: Sara Wells MRN: [id number] Date of Birth: 07-06-1941

## 2017-05-22 ENCOUNTER — Encounter: Payer: Self-pay | Admitting: Physical Therapy

## 2017-05-22 ENCOUNTER — Ambulatory Visit: Payer: Medicare Other | Admitting: Physical Therapy

## 2017-05-22 DIAGNOSIS — M62830 Muscle spasm of back: Secondary | ICD-10-CM

## 2017-05-22 DIAGNOSIS — M5441 Lumbago with sciatica, right side: Secondary | ICD-10-CM

## 2017-05-22 NOTE — Therapy (Addendum)
[place], [place], [address] Phone: [phone]   Fax:  [phone]  Physical Therapy Treatment  Patient Details  Name: Sara Wells MRN: [id number] Date of Birth: February 25, 1941 Referring Provider: Ninfa Linden  Encounter Date: 05/22/2017      PT End of Session - 05/22/17 1558    Visit Number 7   Date for PT Re-Evaluation 06/22/17   PT Start Time 4492   PT Stop Time 1612   PT Time Calculation (min) 57 min   Activity Tolerance Patient tolerated treatment well   Behavior During Therapy [facility] for tasks assessed/performed      Past Medical History:  Diagnosis Date  . Arthritis    right hand  . Breast cancer ([place]) 10/05/2002   right  . Endometriosis   . GERD (gastroesophageal reflux disease)     Past Surgical History:  Procedure Laterality Date  . ABDOMINAL HYSTERECTOMY     age 65  . BREAST SURGERY  2004&2009   RIGHT & LEFT  . MOLE REMOVAL  number of years prior to 2004   early melanoma    There were no vitals filed for this visit.      Subjective Assessment - 05/22/17 1515    Subjective Pt reports that she is still having trouble in both HS pain and weakness   Currently in Pain? No/denies   Pain Score 0-No pain                         [facility] Adult PT Treatment/Exercise - 05/22/17 0001      Ambulation/Gait   Gait Comments walked around building ~1/10 mile up and down slope.      Lumbar Exercises: Aerobic   Elliptical L 5 6 min Nustep     Lumbar Exercises: Machines for Strengthening   Cybex Knee Extension 10lb 2x15   Cybex Knee Flexion 25lb 2x15   Leg Press 40# 3 sets 10   Other Lumbar Machine Exercise rows and lats 25lb 2x10      Lumbar Exercises: Standing   Other Standing Lumbar Exercises wt ball OH ext     Traction   Type of Traction Lumbar   Max (lbs) 60   Time 15                  PT Short Term Goals - 04/30/17 1603      PT SHORT TERM  GOAL #1   Title independent with initial HEP   Status Achieved           PT [place] Term Goals - 05/22/17 1559      PT Stepney TERM GOAL #1   Title understand proper posture and body mechanics   Status Partially Met     PT Richens TERM GOAL #2   Title increase lumbar ROM 25%     PT Desa TERM GOAL #3   Title decrease pain 50%   Status Partially Met     PT Debell TERM GOAL #4   Title tolerate standing to do dishes without difficulty   Status Not Met               Plan - 05/22/17 1558    Clinical Impression Statement Pt continues to progress well, c/o pain in HS in the morning when she wakes up. Good strength and ROM with all exercises. increased traction by 10lb.   Rehab Potential Good   PT Frequency  1x / week   PT Duration 8 weeks   PT Treatment/Interventions Cryotherapy;Electrical Stimulation;Moist Heat;Traction;Ultrasound;Stair training;Patient/family education;Therapeutic exercise;Therapeutic activities;Manual techniques   PT Next Visit Plan spine inj 10/18. Assess traction and progress      Patient will benefit from skilled therapeutic intervention in order to improve the following deficits and impairments:  Abnormal gait, Decreased activity tolerance, Decreased mobility, Postural dysfunction, Impaired flexibility, Improper body mechanics, Pain, Increased muscle spasms, Decreased range of motion, Difficulty walking  Visit Diagnosis: Muscle spasm of back  Acute right-sided low back pain with right-sided sciatica     Problem List Patient Active Problem List   Diagnosis Date Noted  . Chronic right-sided low back pain with right-sided sciatica 02/25/2017  . Spondylolisthesis, lumbar region 02/25/2017  . Breast cancer ([place]) 10/05/2002     PHYSICAL THERAPY DISCHARGE SUMMARY  Visits from Start of Care: 7  Plan: Patient agrees to discharge.  Patient goals were partially met. Patient is being discharged due to being pleased with the current functional level.  ?????        Scot Jun, PTA 05/22/2017, 4:00 PM  [place], [place], [address] Phone: [phone]   Fax:  [phone]  Name: Sara Wells MRN: [id number] Date of Birth: [birth year]

## 2017-05-23 ENCOUNTER — Encounter (INDEPENDENT_AMBULATORY_CARE_PROVIDER_SITE_OTHER): Payer: Self-pay | Admitting: Physical Medicine and Rehabilitation

## 2017-05-23 ENCOUNTER — Ambulatory Visit (INDEPENDENT_AMBULATORY_CARE_PROVIDER_SITE_OTHER): Payer: Medicare Other

## 2017-05-23 ENCOUNTER — Ambulatory Visit (INDEPENDENT_AMBULATORY_CARE_PROVIDER_SITE_OTHER): Payer: Medicare Other | Admitting: Physical Medicine and Rehabilitation

## 2017-05-23 VITALS — BP 170/87 | HR 59 | Temp 97.7°F

## 2017-05-23 DIAGNOSIS — M5416 Radiculopathy, lumbar region: Secondary | ICD-10-CM | POA: Diagnosis not present

## 2017-05-23 DIAGNOSIS — M48062 Spinal stenosis, lumbar region with neurogenic claudication: Secondary | ICD-10-CM

## 2017-05-23 MED ORDER — LIDOCAINE HCL (PF) 1 % IJ SOLN
2.0000 mL | Freq: Once | INTRAMUSCULAR | Status: AC
  Administered 2017-05-23: 2 mL

## 2017-05-23 MED ORDER — BETAMETHASONE SOD PHOS & ACET 6 (3-3) MG/ML IJ SUSP
12.0000 mg | Freq: Once | INTRAMUSCULAR | Status: AC
  Administered 2017-05-23: 12 mg

## 2017-05-23 NOTE — Progress Notes (Deleted)
Lower back pain with bilateral leg numbness. States right side back and right leg is worse. Constant pain. Most pain is posterior thighs, with tingling sensations.  Last inj. 03/26/17 did well for 5 days, pain slowly became worse.  Meloxicam  No contrast allergy. No blood thinners. Has drivers.

## 2017-05-23 NOTE — Patient Instructions (Signed)

## 2017-06-05 NOTE — Procedures (Signed)
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  We saw her in August she was having more right low back and radicular leg pain.  We completed a right L4 and L5 transforaminal pain slowly worsened over that time.  She reports now bilateral leg pain and numbness.  Right side and right leg is worse but it is bilateral.  It is down the posterior thighs.  She is taking meloxicam.  We will go ahead today and repeat the injection but do a bilateral L4 transforaminal injection.  Depending on her relief she probably would need to see a neurosurgeon or spine surgeon for evaluation.  She likely would do well with decompression laminectomy.  Lumbosacral Transforaminal Epidural Steroid Injection - Sub-Pedicular Approach with Fluoroscopic Guidance  Patient: Sara Wells      Date of Birth: April 23, 1941 MRN: [id number] PCP: Haywood Pao, MD      Visit Date: 05/23/2017   Universal Protocol:    Date/Time: 05/23/2017  Consent Given By: the patient  Position: PRONE  Additional Comments: Vital signs were monitored before and after the procedure. Patient was prepped and draped in the usual sterile fashion. The correct patient, procedure, and site was verified.   Injection Procedure Details:  Procedure Site One Meds Administered:  Meds ordered this encounter  Medications  . lidocaine (PF) (XYLOCAINE) 1 % injection 2 mL  . betamethasone acetate-betamethasone sodium phosphate (CELESTONE) injection 12 mg    Laterality: Bilateral  Location/Site:  L4-L5  Needle size: 22 G  Needle type: Spinal  Needle Placement: Transforaminal  Findings:  -Contrast Used: 1 mL iohexol 180 mg iodine/mL   -Comments: Excellent flow of contrast along the nerve and into the epidural space.  Procedure Details: After squaring off the end-plates to get a true AP view, the C-arm was positioned so that an oblique view of the foramen as noted  above was visualized. The target area is just inferior to the "nose of the scotty dog" or sub pedicular. The soft tissues overlying this structure were infiltrated with 2-3 ml. of 1% Lidocaine without Epinephrine.  The spinal needle was inserted toward the target using a "trajectory" view along the fluoroscope beam.  Under AP and lateral visualization, the needle was advanced so it did not puncture dura and was located close the 6 O'Clock position of the pedical in AP tracterory. Biplanar projections were used to confirm position. Aspiration was confirmed to be negative for CSF and/or blood. A 1-2 ml. volume of Isovue-250 was injected and flow of contrast was noted at each level. Radiographs were obtained for documentation purposes.   After attaining the desired flow of contrast documented above, a 0.5 to 1.0 ml test dose of 0.25% Marcaine was injected into each respective transforaminal space.  The patient was observed for 90 seconds post injection.  After no sensory deficits were reported, and normal lower extremity motor function was noted,   the above injectate was administered so that equal amounts of the injectate were placed at each foramen (level) into the transforaminal epidural space.   Additional Comments:  The patient tolerated the procedure well Dressing: Band-Aid    Post-procedure details: Patient was observed during the procedure. Post-procedure instructions were reviewed.  Patient left the clinic in stable condition.

## 2017-07-03 ENCOUNTER — Ambulatory Visit (INDEPENDENT_AMBULATORY_CARE_PROVIDER_SITE_OTHER): Payer: Medicare Other | Admitting: Orthopaedic Surgery

## 2017-07-03 ENCOUNTER — Encounter (INDEPENDENT_AMBULATORY_CARE_PROVIDER_SITE_OTHER): Payer: Self-pay | Admitting: Orthopaedic Surgery

## 2017-07-03 DIAGNOSIS — M4316 Spondylolisthesis, lumbar region: Secondary | ICD-10-CM | POA: Diagnosis not present

## 2017-07-03 MED ORDER — GABAPENTIN 300 MG PO CAPS: 300.0000 mg | ORAL_CAPSULE | Freq: Two times a day (BID) | ORAL | 1 refills | Status: DC

## 2017-07-03 NOTE — Progress Notes (Signed)
The patient is returning after having bilateral L4-L5 injections by Dr. Ernestina Patches.  From his note he feels like she may need a decompressive laminectomy.  She still having pain from her back going into both legs.  She still has weakness in her legs with numbness and tingling.  She only takes meloxicam for pain.  She says is worse in the morning and that she gets to tolerate it more as the day goes on.  On exam she still has a significantly positive straight leg raise to the right side with numbness and tingling in both her legs.  She is not a diabetic.  There is some slight weakness as well distally.  At this point she is still reluctant for [place] to send her for surgical evaluation.  At this point I would like to try Neurontin 300 mg at night and if she tolerates this well increasing it to twice a day.  She would like to try this first.  I will see her back in 4 weeks and see if this is helped.  All questions and concerns were answered and addressed.

## 2017-07-20 ENCOUNTER — Encounter: Payer: Self-pay | Admitting: Urgent Care

## 2017-07-20 ENCOUNTER — Ambulatory Visit: Payer: Medicare Other | Admitting: Urgent Care

## 2017-07-20 ENCOUNTER — Other Ambulatory Visit: Payer: Self-pay

## 2017-07-20 VITALS — BP 158/86 | HR 83 | Temp 97.9°F | Resp 18 | Ht 63.0 in | Wt 201.0 lb

## 2017-07-20 DIAGNOSIS — R0602 Shortness of breath: Secondary | ICD-10-CM | POA: Diagnosis not present

## 2017-07-20 DIAGNOSIS — I10 Essential (primary) hypertension: Secondary | ICD-10-CM

## 2017-07-20 DIAGNOSIS — R030 Elevated blood-pressure reading, without diagnosis of hypertension: Secondary | ICD-10-CM

## 2017-07-20 DIAGNOSIS — R05 Cough: Secondary | ICD-10-CM | POA: Diagnosis not present

## 2017-07-20 DIAGNOSIS — J22 Unspecified acute lower respiratory infection: Secondary | ICD-10-CM | POA: Diagnosis not present

## 2017-07-20 DIAGNOSIS — R062 Wheezing: Secondary | ICD-10-CM | POA: Diagnosis not present

## 2017-07-20 DIAGNOSIS — R059 Cough, unspecified: Secondary | ICD-10-CM

## 2017-07-20 MED ORDER — BENZONATATE 100 MG PO CAPS: 100.0000 mg | ORAL_CAPSULE | Freq: Three times a day (TID) | ORAL | 0 refills | Status: DC | PRN

## 2017-07-20 MED ORDER — AZITHROMYCIN 250 MG PO TABS: ORAL_TABLET | ORAL | 0 refills | Status: DC

## 2017-07-20 MED ORDER — ALBUTEROL SULFATE HFA 108 (90 BASE) MCG/ACT IN AERS: 2.0000 | INHALATION_SPRAY | Freq: Four times a day (QID) | RESPIRATORY_TRACT | 1 refills | Status: DC | PRN

## 2017-07-20 NOTE — Patient Instructions (Signed)
Cough, Adult Coughing is a reflex that clears your throat and your airways. Coughing helps to heal and protect your lungs. It is normal to cough occasionally, but a cough that happens with other symptoms or lasts a [place] time may be a sign of a condition that needs treatment. A cough may last only 2-3 weeks (acute), or it may last longer than 8 weeks (chronic). What are the causes? Coughing is commonly caused by:  Breathing in substances that irritate your lungs.  A viral or bacterial respiratory infection.  Allergies.  Asthma.  Postnasal drip.  Smoking.  Acid backing up from the stomach into the esophagus (gastroesophageal reflux).  Certain medicines.  Chronic lung problems, including COPD (or rarely, lung cancer).  Other medical conditions such as heart failure.  Follow these instructions at home: Pay attention to any changes in your symptoms. Take these actions to help with your discomfort:  Take medicines only as told by your health care provider. ? If you were prescribed an antibiotic medicine, take it as told by your health care provider. Do not stop taking the antibiotic even if you start to feel better. ? Talk with your health care provider before you take a cough suppressant medicine.  Drink enough fluid to keep your urine clear or pale yellow.  If the air is dry, use a cold steam vaporizer or humidifier in your bedroom or your home to help loosen secretions.  Avoid anything that causes you to cough at work or at home.  If your cough is worse at night, try sleeping in a semi-upright position.  Avoid cigarette smoke. If you smoke, quit smoking. If you need help quitting, ask your health care provider.  Avoid caffeine.  Avoid alcohol.  Rest as needed.  Contact a health care provider if:  You have new symptoms.  You cough up pus.  Your cough does not get better after 2-3 weeks, or your cough gets worse.  You cannot control your cough with suppressant  medicines and you are losing sleep.  You develop pain that is getting worse or pain that is not controlled with pain medicines.  You have a fever.  You have unexplained weight loss.  You have night sweats. Get help right away if:  You cough up blood.  You have difficulty breathing.  Your heartbeat is very fast. This information is not intended to replace advice given to you by your health care provider. Make sure you discuss any questions you have with your health care provider. Document Released: 01/19/2011 Document Revised: 12/29/2015 Document Reviewed: 09/29/2014 Elsevier Interactive Patient Education  2017 Elsevier Inc.  

## 2017-07-20 NOTE — Progress Notes (Signed)
  MRN: [id number] DOB: 10-08-1940  Subjective:   Sara Wells is a 76 y.o. female presenting for 1 week history of productive cough. Cough elicits shob, wheezing. Has had hoarseness. Has had a history of asthma, her albuterol inhaler is expired. Has tried APAP with minimal relief. Denies fever, chest pain, n/v, abdominal pain, sinus pain, ear pain, dyspnea. Denies smoking cigarettes. Her PCP is Dr. Osborne Casco and is working with him for managing HTN. She does not have a cardiologist, denies history of heart failure. Denies smoking cigarettes.   Sara Wells has a current medication list which includes the following prescription(s): albuterol, azithromycin, calcium, cholecalciferol, esomeprazole, gabapentin, meloxicam, meloxicam, methylprednisolone, methylprednisolone, multiple vitamins-minerals, and vitamin e. Also is allergic to codeine.  Sara Wells  has a past medical history of Arthritis, Breast cancer ([place]) (10/05/2002), Endometriosis, and GERD (gastroesophageal reflux disease). Also  has a past surgical history that includes Breast surgery (2004&2009); Abdominal hysterectomy; and Mole removal (number of years prior to 2004).  Objective:   Vitals: BP (!) 158/86 (BP Location: Left Arm, Patient Position: Sitting, Cuff Size: Large)   Pulse 83   Temp 97.9 F (36.6 C) (Oral)   Resp 18   Ht 5\' 3"  (1.6 m)   Wt 201 lb (91.2 kg)   SpO2 96%   BMI 35.61 kg/m   BP Readings from Last 3 Encounters:  07/20/17 (!) 158/86  05/23/17 (!) 170/87  03/26/17 (!) 174/90    Physical Exam  Constitutional: She is oriented to person, place, and time. She appears well-developed and well-nourished.  HENT:  TM's intact bilaterally, no effusions or erythema. Nasal turbinates pink and moist, nasal passages patent. No sinus tenderness. Oropharynx clear, mucous membranes moist.   Eyes: Right eye exhibits no discharge. Left eye exhibits no discharge.  Neck: Normal range of motion. Neck supple.  Cardiovascular: Normal rate, regular  rhythm and intact distal pulses. Exam reveals no gallop and no friction rub.  No murmur heard. Pulmonary/Chest: No respiratory distress. She has wheezes (mild over mid-lower lung fields). She has no rales.  Musculoskeletal: She exhibits no edema.  Neurological: She is alert and oriented to person, place, and time.  Skin: Skin is warm and dry.  Psychiatric: She has a normal mood and affect.   Assessment and Plan :   1. Lower respiratory infection 2. Wheezing 3. Cough - Will start on azithromycin, prednisone course. Recommended supportive care. Return-to-clinic precautions discussed, patient verbalized understanding.   4. Shortness of breath 5. Essential hypertension 6. Elevated blood pressure reading - Brain natriuretic peptide pending - Counseled on need for her to see her PCP for management of her HTN, discussed possibility of heart failure. ER and return-to-clinic precautions discussed, patient verbalized understanding.   Jaynee Eagles, PA-C [facility] [phone] 07/20/2017  3:11 PM

## 2017-07-22 LAB — BRAIN NATRIURETIC PEPTIDE: BNP: 128.8 pg/mL — ABNORMAL HIGH (ref 0.0–100.0)

## 2017-07-24 ENCOUNTER — Encounter: Payer: Self-pay | Admitting: Urgent Care

## 2017-08-05 ENCOUNTER — Ambulatory Visit (INDEPENDENT_AMBULATORY_CARE_PROVIDER_SITE_OTHER): Payer: Medicare Other | Admitting: Orthopaedic Surgery

## 2018-01-07 ENCOUNTER — Other Ambulatory Visit (HOSPITAL_COMMUNITY): Payer: Self-pay | Admitting: Internal Medicine

## 2018-01-07 DIAGNOSIS — R0609 Other forms of dyspnea: Principal | ICD-10-CM

## 2018-01-08 ENCOUNTER — Other Ambulatory Visit: Payer: Self-pay

## 2018-01-08 ENCOUNTER — Ambulatory Visit (HOSPITAL_COMMUNITY): Payer: Medicare Other | Attending: Cardiovascular Disease

## 2018-01-08 DIAGNOSIS — I340 Nonrheumatic mitral (valve) insufficiency: Secondary | ICD-10-CM | POA: Diagnosis not present

## 2018-01-08 DIAGNOSIS — Z853 Personal history of malignant neoplasm of breast: Secondary | ICD-10-CM | POA: Diagnosis not present

## 2018-01-08 DIAGNOSIS — R0609 Other forms of dyspnea: Secondary | ICD-10-CM | POA: Diagnosis not present

## 2018-02-04 NOTE — Progress Notes (Signed)
Cardiology Office Note   Date:  02/05/2018   ID:  Sara Wells, DOB [birth year], MRN [id number]  PCP:  Haywood Pao, MD  Cardiologist:  Dr. Ellyn Hack (new)  Chief Complaint  Patient presents with  . Shortness of Breath  . New Patient (Initial Visit)     History of Present Illness: Sara Wells is a 77 y.o. female who presents for establishment with cardiology at the request of Tisovec for dyspnea, chronic LEE, and hypertension. She has a history of hypertension and chronic dyspnea which is worsening in severity. She states that she has to stop when she walks due dyspnea but recovers quickly. She denies chest pain but does feel pressure with ambulation. She has noticed BP is harder to control.   She has a strong FH of CAD in both parents dying from MI's in their 61;s, two older brothers with CAD, both with MI and stents. She does not smoke, drink or use elicit drugs. She has never been diagnosed with OSA. 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  She was seen by Dr.Tisovec and placed on lasix 20 mg prn for LEE. She has elevated cholesterol by recent labs but is not taking a statin.   Had an echo 01/08/2018 Left ventricle: The cavity size was normal. Systolic function was   normal. The estimated ejection fraction was 55%. Wall motion was   normal; there were no regional wall motion abnormalities. Doppler   parameters are consistent with both elevated ventricular   end-diastolic filling pressure and elevated left atrial filling   pressure. - Mitral valve: There was mild regurgitation. Valve area by   pressure half-time: 2.06 cm^2. - Left atrium: The atrium was mildly dilated. - Atrial septum: No defect or patent foramen ovale was identified. - Impressions: GLS -17   Past Medical History:  Diagnosis Date  . Arthritis    right hand  . Breast cancer ([place]) 10/05/2002   right  . Endometriosis   . GERD (gastroesophageal reflux disease)     Past Surgical  History:  Procedure Laterality Date  . ABDOMINAL HYSTERECTOMY     age 62  . BREAST SURGERY  2004&2009   RIGHT & LEFT  . MOLE REMOVAL  number of years prior to 2004   early melanoma     Current Outpatient Medications  Medication Sig Dispense Refill  . albuterol (PROVENTIL HFA;VENTOLIN HFA) 108 (90 Base) MCG/ACT inhaler Inhale 2 puffs into the lungs every 6 (six) hours as needed for wheezing or shortness of breath. 1 Inhaler 1  . CALCIUM PO Take by mouth. Reported on 07/26/2015    . Cholecalciferol (VITAMIN D PO) Take by mouth. Reported on 07/26/2015    . esomeprazole (NEXIUM) 40 MG capsule Take 40 mg by mouth as needed. Reported on 07/26/2015    . furosemide (LASIX) 20 MG tablet Take 20 mg by mouth every morning.  5  . lisinopril (PRINIVIL,ZESTRIL) 2.5 MG tablet Take 1 tablet (2.5 mg total) by mouth daily. 30 tablet 3  . Multiple Vitamin (MULTIVITAMIN PO) Take by mouth. Reported on 07/26/2015    . VITAMIN E PO Take by mouth. Reported on 07/26/2015     No current facility-administered medications for this visit.     Allergies:   Codeine    Social History:  The patient  reports that she has never smoked. She has never used smokeless tobacco. She reports that she does not drink alcohol or use drugs.   Family History:  The patient's family history includes Heart attack in her father and mother; Heart disease in her brother and brother.    ROS: All other systems are reviewed and negative. Unless otherwise mentioned in H&P    PHYSICAL EXAM: VS:  BP (!) 172/91 (BP Location: Right Arm)   Pulse 72   Ht 5\' 3"  (1.6 m)   Wt 208 lb 6.4 oz (94.5 kg)   BMI 36.92 kg/m  , BMI Body mass index is 36.92 kg/m. GEN: Well nourished, well developed, in no acute distress  HEENT: normal  Neck: no JVD, carotid bruits, or masses Cardiac: RRR; B7/6 systolic murmurs , rubs, or gallops,no edema  Respiratory:  clear to auscultation bilaterally, normal work of breathing GI: soft, nontender,  nondistended, + BS MS: no deformity or atrophy  Skin: warm and dry, no rash Neuro:  Strength and sensation are intact Psych: euthymic mood, full affect   EKG: NSR rate of 72 bpm  Recent Labs: 07/20/2017: BNP 128.8    Lipid Panel    Component Value Date/Time   CHOL 213 (H) 02/25/2013 1001   TRIG 146 02/25/2013 1001   HDL 57 02/25/2013 1001   CHOLHDL 3.7 02/25/2013 1001   VLDL 29 02/25/2013 1001   LDLCALC 127 (H) 02/25/2013 1001      Wt Readings from Last 3 Encounters:  02/05/18 208 lb 6.4 oz (94.5 kg)  07/20/17 201 lb (91.2 kg)  07/26/15 193 lb (87.5 kg)      Other studies Reviewed: None other than echo above   ASSESSMENT AND PLAN:  1. Dyspnea: Echo did not reveal reduced EF, she has no prior history of OSA. Due to CVRF for CAD, (age, FH, Hypertension, Hypercholesterolemia, and obesity), I will plan a NM stress test for diagnostic/rprognostic purposes. This test has been explained to her and she is willing to proceed.   2. Hypertension: I have started her on lisinopril 2.5 mg daily. Review of previous BP demonstrated BP's in the 283'T systolic. Echocardiogram did not reveal diastolic dysfunction or pulmonary hypertension. She is counseled on a low sodium diet.   3. Hypercholesterolemia: Currently not on statin. Will follow. She is to be more mindful of her diet and avoid high cholesterol foods.  4.  Chronic LEE: She is currently on prn lasix 20 mg for edema. She does not appear to have significant fluid overload.   Current medicines are reviewed at length with the patient today.    Labs/ tests ordered today include: NM Stress test. BMET and Mg/.  I have discussed this with Dr. Ellyn Hack who has reviewed the patient's chart and labs. He is in agreement with my assessment and plan.   Phill Myron. West Pugh, ANP, AACC   02/05/2018 5:09 PM    [facility] [address], [place], [place] [address] Phone: [phone]; Fax: [phone]

## 2018-02-05 ENCOUNTER — Encounter: Payer: Self-pay | Admitting: Cardiology

## 2018-02-05 ENCOUNTER — Ambulatory Visit: Payer: Medicare Other | Admitting: Adult Health

## 2018-02-05 ENCOUNTER — Encounter: Payer: Self-pay | Admitting: Adult Health

## 2018-02-05 VITALS — BP 172/91 | HR 72 | Ht 63.0 in | Wt 208.4 lb

## 2018-02-05 DIAGNOSIS — Z79899 Other long term (current) drug therapy: Secondary | ICD-10-CM

## 2018-02-05 DIAGNOSIS — I10 Essential (primary) hypertension: Secondary | ICD-10-CM | POA: Diagnosis not present

## 2018-02-05 DIAGNOSIS — R0789 Other chest pain: Secondary | ICD-10-CM

## 2018-02-05 MED ORDER — LISINOPRIL 2.5 MG PO TABS: 2.5000 mg | ORAL_TABLET | Freq: Every day | ORAL | 3 refills | Status: DC

## 2018-02-05 NOTE — Patient Instructions (Signed)
Medication Instructions:  LISINOPRIL 2.5MG  DAILY  If you need a refill on your cardiac medications before your next appointment, please call your pharmacy.  Labwork: FASTING LIPID PANEL,LFT AND BMET-WHEN YOU COME IN FOR THE STRESS TESTING HERE IN OUR OFFICE AT [facility]  Take the provided lab slips with you to the lab for your blood draw.   You will need to fast. DO NOT EAT OR DRINK PAST MIDNIGHT.   Testing/Procedures: Your physician has requested that you have an Exercise Myoview. A cardiac stress test is a cardiological test that measures the heart's ability to respond to external stress in a controlled clinical environment. The stress response is induced by exercise (exercise-treadmill) or by intravenous pharmacological stimulation   For further information please visit [email or web address]. Please follow instructions below.  How to prepare for your Myocardial Perfusion Test:   Do not eat or drink 3 hours prior to your test, except you may have water.  Do not consume products containing caffeine (regular or decaffeinated) 12 hours prior to your test. (ex: coffee, chocolate, sodas, tea).  Do wear comfortable clothes (no dresses or overalls) and walking shoes, tennis shoes preferred (No heels or open toe shoes are allowed).  Do NOT wear cologne, perfume, aftershave, or lotions (deodorant is allowed).  If these instructions are not followed, your test will have to be rescheduled.  If you have questions or concerns about your appointment, you can call the Nuclear Lab at [phone].  Follow-Up: Your physician wants you to follow-up in: AFTER TESTING WITH DR Chidester ([place]), DNP,AACC IF PRIMARY CARDIOLOGIST IS UNAVAILABLE.   Thank you for choosing [facility] at [facility]!!

## 2018-02-19 ENCOUNTER — Telehealth (HOSPITAL_COMMUNITY): Payer: Self-pay

## 2018-02-19 NOTE — Telephone Encounter (Signed)
Encounter complete. 

## 2018-02-21 ENCOUNTER — Ambulatory Visit (HOSPITAL_COMMUNITY)
Admission: RE | Admit: 2018-02-21 | Discharge: 2018-02-21 | Disposition: A | Discharge status: Home / Self Care | Payer: Medicare Other | Source: Ambulatory Visit | Attending: Cardiology | Admitting: Cardiology

## 2018-02-21 DIAGNOSIS — Z79899 Other long term (current) drug therapy: Secondary | ICD-10-CM | POA: Diagnosis not present

## 2018-02-21 DIAGNOSIS — I10 Essential (primary) hypertension: Secondary | ICD-10-CM

## 2018-02-21 DIAGNOSIS — R0789 Other chest pain: Secondary | ICD-10-CM

## 2018-02-21 LAB — LIPID PANEL
Chol/HDL Ratio: 2.9 ratio (ref 0.0–4.4)
Cholesterol, Total: 176 mg/dL (ref 100–199)
HDL: 60 mg/dL (ref >39)
LDL Calculated: 98 mg/dL (ref 0–99)
Triglycerides: 90 mg/dL (ref 0–149)
VLDL Cholesterol Cal: 18 mg/dL (ref 5–40)

## 2018-02-21 LAB — BASIC METABOLIC PANEL
BUN/Creatinine Ratio: 11 — ABNORMAL LOW (ref 12–28)
BUN: 10 mg/dL (ref 8–27)
CO2: 24 mmol/L (ref 20–29)
Calcium: 8.8 mg/dL (ref 8.7–10.3)
Chloride: 103 mmol/L (ref 96–106)
Creatinine, Ser: 0.87 mg/dL (ref 0.57–1.00)
GFR calc Af Amer: 74 mL/min/{1.73_m2} (ref >59)
GFR calc non Af Amer: 64 mL/min/{1.73_m2} (ref >59)
Glucose: 98 mg/dL (ref 65–99)
Potassium: 4.1 mmol/L (ref 3.5–5.2)
Sodium: 142 mmol/L (ref 134–144)

## 2018-02-21 LAB — HEPATIC FUNCTION PANEL
ALT: 11 IU/L (ref 0–32)
AST: 14 IU/L (ref 0–40)
Albumin: 3.8 g/dL (ref 3.5–4.8)
Alkaline Phosphatase: 53 IU/L (ref 39–117)
Bilirubin Total: 0.6 mg/dL (ref 0.0–1.2)
Bilirubin, Direct: 0.16 mg/dL (ref 0.00–0.40)
Total Protein: 5.9 g/dL — ABNORMAL LOW (ref 6.0–8.5)

## 2018-02-21 LAB — MYOCARDIAL PERFUSION IMAGING
CHL CUP NUCLEAR SDS: 3
CSEPED: 5 min
Estimated workload: 6.2 METS
Exercise duration (sec): 10 s
LV sys vol: 34 mL
LVDIAVOL: 83 mL (ref 46–106)
MPHR: 143 {beats}/min
NUC STRESS TID: 1.12
Peak HR: 155 {beats}/min
Percent HR: 108 %
RPE: 19
Rest HR: 53 {beats}/min
SRS: 2
SSS: 4

## 2018-02-21 MED ORDER — TECHNETIUM TC 99M TETROFOSMIN IV KIT
31.9000 | PACK | Freq: Once | INTRAVENOUS | Status: AC | PRN
  Administered 2018-02-21: 31.9 via INTRAVENOUS
  Filled 2018-02-21: qty 32

## 2018-02-21 MED ORDER — TECHNETIUM TC 99M TETROFOSMIN IV KIT
10.4000 | PACK | Freq: Once | INTRAVENOUS | Status: AC | PRN
  Administered 2018-02-21: 10.4 via INTRAVENOUS
  Filled 2018-02-21: qty 11

## 2018-02-21 MED ORDER — REGADENOSON 0.4 MG/5ML IV SOLN: 0.4000 mg | Freq: Once | INTRAVENOUS | Status: DC

## 2018-03-10 NOTE — Progress Notes (Signed)
Cardiology Office Note   Date:  03/11/2018   ID:  JANELYS GLASSNER, DOB February 26, 1941, MRN [id number]  PCP:  Haywood Pao, MD  Cardiologist:  Dr. Ellyn Hack  Chief Complaint  Patient presents with  . Hypertension  . Chest Pain     History of Present Illness: Sara Wells is a 77 y.o. female who presents for ongoing assessment and management of hypertension, chronic DOE, and LEE. She was seen as a new patient on 02/05/2018. She has a strong FH of CAD in both parents and brothers. A MM stress test was ordered for diagnostic/prognostic purposes. She was started on low dose lisinopril 2.5 mg due to hypertension. She was continued on prn lasix.   Stress test completed on 02/21/2018 was normal, without evidence of ischemia, or EKG changes. Found to be a low risk study.  She continues to have complaints of some dependent edema.   Past Medical History:  Diagnosis Date  . Arthritis    right hand  . Breast cancer ([place]) 10/05/2002   right  . Endometriosis   . GERD (gastroesophageal reflux disease)     Past Surgical History:  Procedure Laterality Date  . ABDOMINAL HYSTERECTOMY     age 54  . BREAST SURGERY  2004&2009   RIGHT & LEFT  . MOLE REMOVAL  number of years prior to 2004   early melanoma     Current Outpatient Medications  Medication Sig Dispense Refill  . albuterol (PROVENTIL HFA;VENTOLIN HFA) 108 (90 Base) MCG/ACT inhaler Inhale 2 puffs into the lungs every 6 (six) hours as needed for wheezing or shortness of breath. 1 Inhaler 1  . CALCIUM PO Take by mouth. Reported on 07/26/2015    . Cholecalciferol (VITAMIN D PO) Take by mouth. Reported on 07/26/2015    . esomeprazole (NEXIUM) 40 MG capsule Take 40 mg by mouth as needed. Reported on 07/26/2015    . furosemide (LASIX) 20 MG tablet Take 20 mg by mouth every morning.  5  . lisinopril (PRINIVIL,ZESTRIL) 5 MG tablet Take 1 tablet (5 mg total) by mouth daily. 30 tablet 3  . Multiple Vitamin (MULTIVITAMIN PO) Take by mouth. Reported on  07/26/2015    . VITAMIN E PO Take by mouth. Reported on 07/26/2015    . hydrochlorothiazide (MICROZIDE) 12.5 MG capsule Take 1 capsule (12.5 mg total) by mouth daily. 30 capsule 3   No current facility-administered medications for this visit.     Allergies:   Codeine    Social History:  The patient  reports that she has never smoked. She has never used smokeless tobacco. She reports that she does not drink alcohol or use drugs.   Family History:  The patient's family history includes Heart attack in her father and mother; Heart disease in her brother and brother.    ROS: All other systems are reviewed and negative. Unless otherwise mentioned in H&P    PHYSICAL EXAM: VS:  BP (!) 156/82   Pulse 64   Ht 5\' 3"  (1.6 m)   Wt 208 lb 12.8 oz (94.7 kg)   BMI 36.99 kg/m  , BMI Body mass index is 36.99 kg/m. GEN: Well nourished, well developed, in no acute distress  HEENT: normal  Neck: no JVD, carotid bruits, or masses Cardiac: RRR; no murmurs, rubs, or gallops,no edema  Respiratory:  clear to auscultation bilaterally, normal work of breathing GI: soft, nontender, nondistended, + BS MS: no deformity or atrophy  Skin: warm and dry, no rash Neuro:  Strength and sensation are intact Psych: euthymic mood, full affect   EKG:  Not competed this office visit.   Recent Labs: 07/20/2017: BNP 128.8 02/21/2018: ALT 11; BUN 10; Creatinine, Ser 0.87; Potassium 4.1; Sodium 142    Lipid Panel    Component Value Date/Time   CHOL 176 02/21/2018 0844   TRIG 90 02/21/2018 0844   HDL 60 02/21/2018 0844   CHOLHDL 2.9 02/21/2018 0844   CHOLHDL 3.7 02/25/2013 1001   VLDL 29 02/25/2013 1001   LDLCALC 98 02/21/2018 0844      Wt Readings from Last 3 Encounters:  03/11/18 208 lb 12.8 oz (94.7 kg)  02/21/18 208 lb (94.3 kg)  02/05/18 208 lb 6.4 oz (94.5 kg)      Other studies Reviewed: Echocardiogram 02/06/18 Left ventricle: The cavity size was normal. Systolic function was normal. The  estimated ejection fraction was 55%. Wall motion was normal; there were no regional wall motion abnormalities. Doppler parameters are consistent with both elevated ventricular end-diastolic filling pressure and elevated left atrial filling pressure. - Mitral valve: There was mild regurgitation. Valve area by pressure half-time: 2.06 cm^2. - Left atrium: The atrium was mildly dilated. - Atrial septum: No defect or patent foramen ovale was identified. - Impressions: GLS -17   ASSESSMENT AND PLAN:  1. Chest pressure: Resolved. Stress test is normal. No plans for further ischemic testing.   2.Hypertension: She continues to have mild LEE in dependent position with mild hypertension. Will increase lisinopril to 5 mg and add HCTZ 12.5 mg daily. She can use lasix prn edema.   3. Obesity: Exercise and weight loss is recommended.   Current medicines are reviewed at length with the patient today.    Labs/ tests ordered today include: None  Phill Myron. West Pugh, ANP, AACC   03/11/2018 12:30 PM    [facility] [address] [place], [place] [address] Phone: [phone]; Fax: [phone]

## 2018-03-11 ENCOUNTER — Encounter: Payer: Self-pay | Admitting: Adult Health

## 2018-03-11 ENCOUNTER — Ambulatory Visit: Payer: Medicare Other | Admitting: Adult Health

## 2018-03-11 VITALS — BP 156/82 | HR 64 | Ht 63.0 in | Wt 208.8 lb

## 2018-03-11 DIAGNOSIS — I10 Essential (primary) hypertension: Secondary | ICD-10-CM

## 2018-03-11 DIAGNOSIS — R0789 Other chest pain: Secondary | ICD-10-CM | POA: Diagnosis not present

## 2018-03-11 DIAGNOSIS — Z79899 Other long term (current) drug therapy: Secondary | ICD-10-CM | POA: Diagnosis not present

## 2018-03-11 MED ORDER — HYDROCHLOROTHIAZIDE 12.5 MG PO CAPS: 12.5000 mg | ORAL_CAPSULE | Freq: Every day | ORAL | 3 refills | Status: DC

## 2018-03-11 MED ORDER — LISINOPRIL 5 MG PO TABS: 5.0000 mg | ORAL_TABLET | Freq: Every day | ORAL | 3 refills | Status: DC

## 2018-03-11 NOTE — Patient Instructions (Signed)
Medication Instructions:  INCREASE LISINOPRIL 5MG  DAILY  START HYDROCHLOROZYDE 12.5MG  DAILY  If you need a refill on your cardiac medications before your next appointment, please call your pharmacy.  Labwork: BMET IN 1 WEEK HERE IN OUR OFFICE AT [facility]  Take the provided lab slips with you to the lab for your blood draw.   You will NOT need to fast   Special Instructions: TAKE AND LOG YOUR BP TWICE DAILY-AM=1 HOUR AFTER MEDICATION PM=4PM  Follow-Up: Your physician wants you to follow-up in: [place] (NURSE PRACTIONIER), DNP,AACC IF PRIMARY CARDIOLOGIST IS UNAVAILABLE.    Thank you for choosing [facility] at [facility]!!

## 2018-03-20 LAB — BASIC METABOLIC PANEL
BUN / CREAT RATIO: 15 (ref 12–28)
BUN: 13 mg/dL (ref 8–27)
CHLORIDE: 100 mmol/L (ref 96–106)
CO2: 27 mmol/L (ref 20–29)
Calcium: 9.2 mg/dL (ref 8.7–10.3)
Creatinine, Ser: 0.89 mg/dL (ref 0.57–1.00)
GFR calc Af Amer: 72 mL/min/{1.73_m2} (ref >59)
GFR calc non Af Amer: 63 mL/min/{1.73_m2} (ref >59)
GLUCOSE: 93 mg/dL (ref 65–99)
POTASSIUM: 3.8 mmol/L (ref 3.5–5.2)
SODIUM: 142 mmol/L (ref 134–144)

## 2018-04-12 NOTE — Progress Notes (Signed)
Cardiology Office Note   Date:  04/14/2018   ID:  LAJADA Sara Wells, DOB [birth year], MRN [id number]  PCP:  Haywood Pao, MD  Cardiologist: Dr. Ellyn Hack  Chief Complaint  Patient presents with  . Follow-up     History of Present Illness: Sara Wells is a 77 y.o. female who presents for ongoing assessment and management of hypertension, chronic LEE, and DOE.  She has had a normal stress test on 02/21/2018. She was last seen in the office on 03/11/2018 and continued to have evidence of LEE and hypertension. Lisinopril was increased to 5 mg daily and HCTZ 12.5 mg was added. She was to continue to use lasix prn for edema.   She comes today with lists of her BP. She has an essentially normal trend, with rare low BP , >00 and rare BP <160 systolic. She states she can tell when her BP is low. She is complaining of worsening DOE. She has an inhaler which she does not use. She got it from PCP when she had pneumonia in Dec of 2019.  Past Medical History:  Diagnosis Date  . Arthritis    right hand  . Breast cancer ([place]) 10/05/2002   right  . Endometriosis   . GERD (gastroesophageal reflux disease)     Past Surgical History:  Procedure Laterality Date  . ABDOMINAL HYSTERECTOMY     age 86  . BREAST SURGERY  2004&2009   RIGHT & LEFT  . MOLE REMOVAL  number of years prior to 2004   early melanoma     Current Outpatient Medications  Medication Sig Dispense Refill  . albuterol (PROVENTIL HFA;VENTOLIN HFA) 108 (90 Base) MCG/ACT inhaler Inhale 2 puffs into the lungs every 6 (six) hours as needed for wheezing or shortness of breath. 1 Inhaler 1  . CALCIUM PO Take by mouth. Reported on 07/26/2015    . Cholecalciferol (VITAMIN D PO) Take by mouth. Reported on 07/26/2015    . esomeprazole (NEXIUM) 40 MG capsule Take 40 mg by mouth as needed. Reported on 07/26/2015    . furosemide (LASIX) 20 MG tablet Take 20 mg by mouth every morning.  5  . hydrochlorothiazide (MICROZIDE) 12.5 MG capsule Take 1  capsule (12.5 mg total) by mouth daily. 30 capsule 3  . lisinopril (PRINIVIL,ZESTRIL) 5 MG tablet Take 1 tablet (5 mg total) by mouth daily. 30 tablet 3  . Multiple Vitamin (MULTIVITAMIN PO) Take by mouth. Reported on 07/26/2015     No current facility-administered medications for this visit.     Allergies:   Codeine    Social History:  The patient  reports that she has never smoked. She has never used smokeless tobacco. She reports that she does not drink alcohol or use drugs.   Family History:  The patient's family history includes Heart attack in her father and mother; Heart disease in her brother and brother.    ROS: All other systems are reviewed and negative. Unless otherwise mentioned in H&P    PHYSICAL EXAM: VS:  BP 124/60   Pulse 86   Ht 5\' 3"  (1.6 m)   Wt 207 lb 6.4 oz (94.1 kg)   BMI 36.74 kg/m  , BMI Body mass index is 36.74 kg/m. GEN: Well nourished, well developed, in no acute distress  HEENT: normal  Neck: no JVD, carotid bruits, or masses Cardiac: RRR; no murmurs, rubs, or gallops,no edema  Respiratory:  clear to auscultation bilaterally, normal work of breathing GI: soft, nontender, nondistended, +  BS MS: no deformity or atrophy  Skin: warm and dry, no rash Neuro:  Strength and sensation are intact Psych: euthymic mood, full affect   EKG:  Not completed this office visit.   Recent Labs: 07/20/2017: BNP 128.8 02/21/2018: ALT 11 03/19/2018: BUN 13; Creatinine, Ser 0.89; Potassium 3.8; Sodium 142    Lipid Panel    Component Value Date/Time   CHOL 176 02/21/2018 0844   TRIG 90 02/21/2018 0844   HDL 60 02/21/2018 0844   CHOLHDL 2.9 02/21/2018 0844   CHOLHDL 3.7 02/25/2013 1001   VLDL 29 02/25/2013 1001   LDLCALC 98 02/21/2018 0844      Wt Readings from Last 3 Encounters:  04/14/18 207 lb 6.4 oz (94.1 kg)  03/11/18 208 lb 12.8 oz (94.7 kg)  02/21/18 208 lb (94.3 kg)      Other studies Reviewed: Echocardiogram Jan 31, 2018 Left ventricle: The  cavity size was normal. Systolic function was   normal. The estimated ejection fraction was 55%. Wall motion was   normal; there were no regional wall motion abnormalities. Doppler   parameters are consistent with both elevated ventricular   end-diastolic filling pressure and elevated left atrial filling   pressure. - Mitral valve: There was mild regurgitation. Valve area by   pressure half-time: 2.06 cm^2. - Left atrium: The atrium was mildly dilated. - Atrial septum: No defect or patent foramen ovale was identified.  ASSESSMENT AND PLAN:  1. Hypertension: She is improved with lisinopril 5 mg and HCTZ 12.5 mg. She has rare episodes of hypotension and hypertension, (1-2 readings over the last 2 weeks). I have asked her to hold HCTZ for hypotension, BP <465 systolic. She will continue to take her regimen as directed otherwise.   2. DOE: She will be ordered PFT's for further evaluation. She states she remains active working in her yard and doing volunteer work. Sometimes gets out of breath.   3. Hx of Breast Cancer: Continue with PCP for follow ups.   Current medicines are reviewed at length with the patient today.    Labs/ tests ordered today include: PFTs  Phill Myron. West Pugh, ANP, AACC   04/14/2018 1:46 PM    [facility] [address] [place], [place] [address] Phone: [phone]; Fax: [phone]

## 2018-04-14 ENCOUNTER — Encounter: Payer: Self-pay | Admitting: Adult Health

## 2018-04-14 ENCOUNTER — Ambulatory Visit: Payer: Medicare Other | Admitting: Adult Health

## 2018-04-14 VITALS — BP 124/60 | HR 86 | Ht 63.0 in | Wt 207.4 lb

## 2018-04-14 DIAGNOSIS — R0600 Dyspnea, unspecified: Secondary | ICD-10-CM | POA: Diagnosis not present

## 2018-04-14 DIAGNOSIS — I10 Essential (primary) hypertension: Secondary | ICD-10-CM

## 2018-04-14 NOTE — Patient Instructions (Signed)
Medication Instructions:  Your physician recommends that you continue on your current medications as directed. Please refer to the Current Medication list given to you today.   Labwork: None ordered  Testing/Procedures: Your physician has recommended that you have a pulmonary function test. Pulmonary Function Tests are a group of tests that measure how well air moves in and out of your lungs.   Follow-Up: Your physician recommends that you schedule a follow-up appointment in: 3 months with Dr.Harding   Any Other Special Instructions Will Be Listed Below (If Applicable).     If you need a refill on your cardiac medications before your next appointment, please call your pharmacy.

## 2018-06-02 ENCOUNTER — Ambulatory Visit (INDEPENDENT_AMBULATORY_CARE_PROVIDER_SITE_OTHER): Payer: Medicare Other | Admitting: Internal Medicine

## 2018-06-02 DIAGNOSIS — R0600 Dyspnea, unspecified: Secondary | ICD-10-CM | POA: Diagnosis not present

## 2018-06-02 LAB — PULMONARY FUNCTION TEST
DL/VA % pred: 120 %
DL/VA: 5.28 ml/min/mmHg/L
DLCO UNC % PRED: 88 %
DLCO unc: 17.82 ml/min/mmHg
FEF 25-75 POST: 1.98 L/s
FEF 25-75 Pre: 2.01 L/sec
FEF2575-%CHANGE-POST: -1 %
FEF2575-%PRED-POST: 142 %
FEF2575-%PRED-PRE: 144 %
FEV1-%Change-Post: 3 %
FEV1-%Pred-Post: 99 %
FEV1-%Pred-Pre: 96 %
FEV1-Post: 1.76 L
FEV1-Pre: 1.7 L
FEV1FVC-%CHANGE-POST: -7 %
FEV1FVC-%PRED-PRE: 111 %
FEV6-%CHANGE-POST: 12 %
FEV6-%PRED-PRE: 91 %
FEV6-%Pred-Post: 102 %
FEV6-Post: 2.31 L
FEV6-Pre: 2.06 L
FEV6FVC-%Pred-Post: 105 %
FEV6FVC-%Pred-Pre: 105 %
FVC-%CHANGE-POST: 12 %
FVC-%PRED-POST: 97 %
FVC-%Pred-Pre: 86 %
FVC-PRE: 2.06 L
FVC-Post: 2.31 L
POST FEV1/FVC RATIO: 77 %
PRE FEV1/FVC RATIO: 83 %
Post FEV6/FVC ratio: 100 %
Pre FEV6/FVC Ratio: 100 %
RV % pred: 102 %
RV: 2.24 L
TLC % pred: 97 %
TLC: 4.48 L

## 2018-06-02 NOTE — Progress Notes (Signed)
Patient completed full PFT today. Pt was not able to complete each attempt available in each phase of the PFT. Pt states she is having hard time to breath out of her mouth when nose clip is on during the test.  PFT ordered by Jory Sims NP.

## 2018-06-04 ENCOUNTER — Telehealth: Payer: Self-pay | Admitting: Adult Health

## 2018-06-04 NOTE — Telephone Encounter (Signed)
Follow Up:    Pt said to let you know she returned your call. She wants you to call her back tomorrow, after 9:00 please.

## 2018-07-14 ENCOUNTER — Ambulatory Visit: Payer: Medicare Other | Admitting: Adult Health

## 2018-07-14 ENCOUNTER — Encounter: Payer: Self-pay | Admitting: Adult Health

## 2018-07-14 VITALS — BP 130/74 | HR 86 | Ht 61.0 in | Wt 209.4 lb

## 2018-07-14 DIAGNOSIS — J22 Unspecified acute lower respiratory infection: Secondary | ICD-10-CM | POA: Diagnosis not present

## 2018-07-14 DIAGNOSIS — R05 Cough: Secondary | ICD-10-CM | POA: Diagnosis not present

## 2018-07-14 DIAGNOSIS — R0600 Dyspnea, unspecified: Secondary | ICD-10-CM | POA: Diagnosis not present

## 2018-07-14 DIAGNOSIS — R059 Cough, unspecified: Secondary | ICD-10-CM

## 2018-07-14 DIAGNOSIS — R062 Wheezing: Secondary | ICD-10-CM | POA: Diagnosis not present

## 2018-07-14 MED ORDER — ALBUTEROL SULFATE HFA 108 (90 BASE) MCG/ACT IN AERS: 2.0000 | INHALATION_SPRAY | Freq: Four times a day (QID) | RESPIRATORY_TRACT | 0 refills | Status: DC | PRN

## 2018-07-14 NOTE — Patient Instructions (Signed)
Follow-Up: You will need a follow up appointment in 6 MONTHS.  Please call our office 2 months in advance(APRIL 2020) to schedule the (June 2020) appointment.  You may see  DR Cecil Cranker, DNP, AACC -or- one of the following Advanced Practice Providers on your designated Care Team:    . Rosaria Ferries, PA-C  -[place], DNP, ANP  Medication Instructions:  NO CHANGES- Your physician recommends that you continue on your current medications as directed. Please refer to the Current Medication list given to you today. If you need a refill on your cardiac medications before your next appointment, please call your pharmacy.  Labwork: If you have labs (blood work) drawn today and your tests are completely normal, you will receive your results ONLY by: . MyChart Message (if you have MyChart) -OR- . A paper copy in the mail At [facility], you and your health needs are our priority.  As part of our continuing mission to provide you with exceptional heart care, we have created designated Provider Care Teams.  These Care Teams include your primary Cardiologist (physician) and Advanced Practice Providers (APPs -  Physician Assistants and Nurse Practitioners) who all work together to provide you with the care you need, when you need it.  Thank you for choosing [facility] at [facility]!!

## 2018-07-14 NOTE — Progress Notes (Signed)
Cardiology Office Note   Date:  07/14/2018   ID:  Sara Wells, DOB 20-Apr-1941, MRN [id number]  PCP:  Haywood Pao, MD  Cardiologist: Dr. Ellyn Hack   Chief Complaint  Patient presents with  . Shortness of Breath  . Hypertension     History of Present Illness: Sara Wells is a 77 y.o. female who presents for ongoing assessment and management of hypertension, chronic LEE, and DOE. She had a normal stress test in 02/21/2018. She was continued on HCTZ and lisinopril and to hold HCTZ for BP , 678 systolic. She was ordered PFT's due to her chronic dyspnea. PFTs revealed moderate obstruction in air exchange. She was advised to see PCP for medication recommendation or referral to pulmonary.   She comes today feeling some better but still has some DOE. She has not seen her PCP for inhalers or referral to pulmonary. She denies any other symptoms. She is medically complaint.   Past Medical History:  Diagnosis Date  . Arthritis    right hand  . Breast cancer ([place]) 10/05/2002   right  . Endometriosis   . GERD (gastroesophageal reflux disease)     Past Surgical History:  Procedure Laterality Date  . ABDOMINAL HYSTERECTOMY     age 11  . BREAST SURGERY  2004&2009   RIGHT & LEFT  . MOLE REMOVAL  number of years prior to 2004   early melanoma     Current Outpatient Medications  Medication Sig Dispense Refill  . albuterol (PROVENTIL HFA;VENTOLIN HFA) 108 (90 Base) MCG/ACT inhaler Inhale 2 puffs into the lungs every 6 (six) hours as needed for wheezing or shortness of breath. 1 Inhaler 0  . Cholecalciferol (VITAMIN D PO) Take by mouth. Reported on 07/26/2015    . vitamin B-12 (CYANOCOBALAMIN) 1000 MCG tablet Take 1,000 mcg by mouth daily.    . hydrochlorothiazide (MICROZIDE) 12.5 MG capsule Take 1 capsule (12.5 mg total) by mouth daily. 30 capsule 3  . lisinopril (PRINIVIL,ZESTRIL) 5 MG tablet Take 1 tablet (5 mg total) by mouth daily. 30 tablet 3   No current facility-administered  medications for this visit.     Allergies:   Codeine    Social History:  The patient  reports that she has never smoked. She has never used smokeless tobacco. She reports that she does not drink alcohol or use drugs.   Family History:  The patient's family history includes Heart attack in her father and mother; Heart disease in her brother and brother.    ROS: All other systems are reviewed and negative. Unless otherwise mentioned in H&P    PHYSICAL EXAM: VS:  BP 130/74   Pulse 86   Ht 5\' 1"  (1.549 m)   Wt 209 lb 6.4 oz (95 kg)   BMI 39.57 kg/m  , BMI Body mass index is 39.57 kg/m. GEN: Well nourished, well developed, in no acute distress HEENT: normal Neck: no JVD, carotid bruits, or masses Cardiac: RRR; no murmurs, rubs, or gallops,no edema  Respiratory:  Clear to auscultation bilaterally, normal work of breathing GI: soft, nontender, nondistended, + BS MS: no deformity or atrophy Skin: warm and dry, no rash Neuro:  Strength and sensation are intact Psych: euthymic mood, full affect   EKG:  EKG is not done this office visit.   Recent Labs: 07/20/2017: BNP 128.8 02/21/2018: ALT 11 03/19/2018: BUN 13; Creatinine, Ser 0.89; Potassium 3.8; Sodium 142    Lipid Panel    Component Value Date/Time   CHOL  176 02/21/2018 0844   TRIG 90 02/21/2018 0844   HDL 60 02/21/2018 0844   CHOLHDL 2.9 02/21/2018 0844   CHOLHDL 3.7 02/25/2013 1001   VLDL 29 02/25/2013 1001   LDLCALC 98 02/21/2018 0844      Wt Readings from Last 3 Encounters:  07/14/18 209 lb 6.4 oz (95 kg)  04/14/18 207 lb 6.4 oz (94.1 kg)  03/11/18 208 lb 12.8 oz (94.7 kg)    Other studies Reviewed: Echocardiogram 2018-01-11  Left ventricle: The cavity size was normal. Systolic function was   normal. The estimated ejection fraction was 55%. Wall motion was   normal; there were no regional wall motion abnormalities. Doppler   parameters are consistent with both elevated ventricular   end-diastolic filling  pressure and elevated left atrial filling   pressure. - Mitral valve: There was mild regurgitation. Valve area by   pressure half-time: 2.06 cm^2. - Left atrium: The atrium was mildly dilated. - Atrial septum: No defect or patent foramen ovale was identified. - Impressions: GLS -17  ASSESSMENT AND PLAN:  1.  Hypertension: BP is well controlled. No changes on her regimen. She will continue on HCTZ and lisinopril. Labs demonstrated normal creatinine.   2. DOE: Combination of weight and abnormal PFT's with moderate obstruction. Will order albuterol inhaler Q 6 hours prn. She will need to be referred to pulmonary as PCP discretion     Current medicines are reviewed at length with the patient today.    Labs/ tests ordered today include: None  Phill Myron. West Pugh, ANP, AACC   07/14/2018 3:22 PM    [place] [address] [place] Office [phone] Fax [phone]

## 2019-07-14 DIAGNOSIS — Z006 Encounter for examination for normal comparison and control in clinical research program: Secondary | ICD-10-CM

## 2019-07-14 NOTE — Research (Signed)
I contacted patient at [phone] to complete annual follow up for her enrollment in NSABP B-42 clinical trial.  Patient answers that she has had no major changes in her health since her last visit with cardiologist last December.  She did mention that she has had a few high blood pressure readings at home.  She says she still struggles with severe pain in her back and legs.  She treats this with over the counter medication.  She has not had any fractures.  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  She did inquire if we knew which arm of the study she was on and if she got the "real" medicine and I explained that we did not have that information yet, but the patients who were on the trial would be notified of their randomization once study outcomes were published.  Patient was very pleasant and agreed for [place] to contact her next year.  Lula Olszewski Oncology Research Associate 07/14/2019 11:07am

## 2019-11-18 ENCOUNTER — Encounter (HOSPITAL_COMMUNITY): Payer: Self-pay

## 2019-11-18 ENCOUNTER — Emergency Department (HOSPITAL_COMMUNITY): Payer: Medicare Other

## 2019-11-18 ENCOUNTER — Emergency Department (HOSPITAL_COMMUNITY)
Admission: EM | Admit: 2019-11-18 | Discharge: 2019-11-18 | Disposition: A | Discharge status: Home / Self Care | Payer: Medicare Other | Attending: Emergency Medicine | Admitting: Emergency Medicine

## 2019-11-18 DIAGNOSIS — R109 Unspecified abdominal pain: Secondary | ICD-10-CM | POA: Insufficient documentation

## 2019-11-18 DIAGNOSIS — Z853 Personal history of malignant neoplasm of breast: Secondary | ICD-10-CM | POA: Diagnosis not present

## 2019-11-18 DIAGNOSIS — M545 Low back pain: Secondary | ICD-10-CM | POA: Diagnosis not present

## 2019-11-18 LAB — BASIC METABOLIC PANEL
Anion gap: 12 (ref 5–15)
BUN: 14 mg/dL (ref 8–23)
CO2: 27 mmol/L (ref 22–32)
Calcium: 9.6 mg/dL (ref 8.9–10.3)
Chloride: 101 mmol/L (ref 98–111)
Creatinine, Ser: 1.01 mg/dL — ABNORMAL HIGH (ref 0.44–1.00)
GFR calc Af Amer: >60 mL/min (ref >60)
GFR calc non Af Amer: 53 mL/min — ABNORMAL LOW (ref >60)
Glucose, Bld: 156 mg/dL — ABNORMAL HIGH (ref 70–99)
Potassium: 3.7 mmol/L (ref 3.5–5.1)
Sodium: 140 mmol/L (ref 135–145)

## 2019-11-18 LAB — CBC
HCT: 46.7 % — ABNORMAL HIGH (ref 36.0–46.0)
Hemoglobin: 15.1 g/dL — ABNORMAL HIGH (ref 12.0–15.0)
MCH: 29.9 pg (ref 26.0–34.0)
MCHC: 32.3 g/dL (ref 30.0–36.0)
MCV: 92.5 fL (ref 80.0–100.0)
Platelets: 201 10*3/uL (ref 150–400)
RBC: 5.05 MIL/uL (ref 3.87–5.11)
RDW: 13.2 % (ref 11.5–15.5)
WBC: 12.8 10*3/uL — ABNORMAL HIGH (ref 4.0–10.5)
nRBC: 0 % (ref 0.0–0.2)

## 2019-11-18 LAB — URINALYSIS, ROUTINE W REFLEX MICROSCOPIC
Bilirubin Urine: NEGATIVE
Glucose, UA: NEGATIVE mg/dL
Hgb urine dipstick: NEGATIVE
Ketones, ur: NEGATIVE mg/dL
Leukocytes,Ua: NEGATIVE
Nitrite: NEGATIVE
Protein, ur: NEGATIVE mg/dL
Specific Gravity, Urine: 1.021 (ref 1.005–1.030)
pH: 5 (ref 5.0–8.0)

## 2019-11-18 MED ORDER — METHOCARBAMOL 500 MG PO TABS: 500.0000 mg | ORAL_TABLET | Freq: Two times a day (BID) | ORAL | 0 refills | Status: DC

## 2019-11-18 MED ORDER — DICYCLOMINE HCL 20 MG PO TABS: 20.0000 mg | ORAL_TABLET | Freq: Two times a day (BID) | ORAL | 0 refills | Status: DC

## 2019-11-18 NOTE — ED Provider Notes (Signed)
[facility] Emergency Department Provider Note MRN:  [id number]  Arrival date & time: 11/18/19     Chief Complaint   Flank Pain   History of Present Illness   Sara Wells is a 79 y.o. year-old female with a history of breast cancer, endometriosis presenting to the ED with chief complaint of flank pain.  Low back pain for the past several days, for the past 2 days pain has been radiating to the right flank, right groin.  Pain is worse with motion or palpation.  Pain is moderate in severity.  Patient is also been experiencing intermittent constipation and diarrhea.  Denies fever, no cough, no chest pain, no shortness of breath, no bowel or bladder incontinence, no numbness or weakness to the arms or legs, no vaginal bleeding or discharge.  Review of Systems  A complete 10 system review of systems was obtained and all systems are negative except as noted in the HPI and PMH.   Patient's Health History    Past Medical History:  Diagnosis Date  . Arthritis    right hand  . Breast cancer ([place]) 10/05/2002   right  . Endometriosis   . GERD (gastroesophageal reflux disease)     Past Surgical History:  Procedure Laterality Date  . ABDOMINAL HYSTERECTOMY     age 42  . BREAST SURGERY  2004&2009   RIGHT & LEFT  . MOLE REMOVAL  number of years prior to 2004   early melanoma    Family History  Problem Relation Age of Onset  . Heart disease Brother   . Heart disease Brother   . Heart attack Mother   . Heart attack Father   . Colon cancer Neg Hx     Social History   Socioeconomic History  . Marital status: Single    Spouse name: Not on file  . Number of children: Not on file  . Years of education: Not on file  . Highest education level: Not on file  Occupational History  . Not on file  Tobacco Use  . Smoking status: Never Smoker  . Smokeless tobacco: Never Used  Substance and Sexual Activity  . Alcohol use: No  . Drug use: No  . Sexual activity:  Not on file  Other Topics Concern  . Not on file  Social History Narrative  . Not on file   Social Determinants of Health   Financial Resource Strain:   . Difficulty of Paying Living Expenses:   Food Insecurity:   . Worried About Charity fundraiser in the Last Year:   . Arboriculturist in the Last Year:   Transportation Needs:   . Film/video editor (Medical):   Marland Kitchen Lack of Transportation (Non-Medical):   Physical Activity:   . Days of Exercise per Week:   . Minutes of Exercise per Session:   Stress:   . Feeling of Stress :   Social Connections:   . Frequency of Communication with Friends and Family:   . Frequency of Social Gatherings with Friends and Family:   . Attends Religious Services:   . Active Member of Clubs or Organizations:   . Attends Archivist Meetings:   Marland Kitchen Marital Status:   Intimate Partner Violence:   . Fear of Current or Ex-Partner:   . Emotionally Abused:   Marland Kitchen Physically Abused:   . Sexually Abused:      Physical Exam   Vitals:   11/18/19 1549 11/18/19 1927  BP: (!) 147/98 (!) 189/73  Pulse: 75 79  Resp:  15  Temp:  98 F (36.7 C)  SpO2: 96% 98%    CONSTITUTIONAL: Well-appearing, NAD NEURO:  Alert and oriented x 3, no focal deficits EYES:  eyes equal and reactive ENT/NECK:  no LAD, no JVD CARDIO: Regular rate, well-perfused, normal S1 and S2 PULM:  CTAB no wheezing or rhonchi GI/GU:  normal bowel sounds, non-distended, non-tender MSK/SPINE:  No gross deformities, no edema SKIN:  no rash, atraumatic PSYCH:  Appropriate speech and behavior  *Additional and/or pertinent findings included in MDM below  Diagnostic and Interventional Summary    EKG Interpretation  Date/Time:    Ventricular Rate:    PR Interval:    QRS Duration:   QT Interval:    QTC Calculation:   R Axis:     Text Interpretation:        Labs Reviewed  BASIC METABOLIC PANEL - Abnormal; Notable for the following components:      Result Value   Glucose,  Bld 156 (*)    Creatinine, Ser 1.01 (*)    GFR calc non Af Amer 53 (*)    All other components within normal limits  CBC - Abnormal; Notable for the following components:   WBC 12.8 (*)    Hemoglobin 15.1 (*)    HCT 46.7 (*)    All other components within normal limits  URINALYSIS, ROUTINE W REFLEX MICROSCOPIC    CT RENAL STONE STUDY  Final Result      Medications - No data to display   Procedures  /  Critical Care Procedures  ED Course and Medical Decision Making  I have reviewed the triage vital signs, the nursing notes, and pertinent available records from the EMR.  Listed above are laboratory and imaging tests that I personally ordered, reviewed, and interpreted and then considered in my medical decision making (see below for details).      Suspicious for kidney stone and given history of breast cancer also considering neoplastic process.  Work-up is very reassuring, normal vital signs, normal labs, normal urine, normal CT.  Upon reassessment, the pain does seem to be reproduced with change of position or movement of the hip, however the range of motion of the hip is well preserved and there is nothing to suggest a septic joint.  Patient is appropriate for discharge with symptomatic management and PCP follow-up.    Barth Kirks. Sedonia Small, MD [place] [email or web address]  Final Clinical Impressions(s) / ED Diagnoses     ICD-10-CM   1. Flank pain  R10.9     ED Discharge Orders         Ordered    dicyclomine (BENTYL) 20 MG tablet  2 times daily     11/18/19 2218    methocarbamol (ROBAXIN) 500 MG tablet  2 times daily     11/18/19 2218           Discharge Instructions Discussed with and Provided to Patient:     Discharge Instructions     You were evaluated in the Emergency Department and after careful evaluation, we did not find any emergent condition requiring admission or further testing in the hospital.  Your  exam/testing today is overall reassuring.  Your symptoms may be due to cramping due to diarrhea or may be due to muscle strain or spasm.  You can use the Bentyl medication as needed for abdominal discomfort.  You can use the  Robaxin medication for back pain.  Please do not take both medications at the same time.  Please return to the Emergency Department if you experience any worsening of your condition.  We encourage you to follow up with a primary care provider.  Thank you for allowing [place] to be a part of your care.       Maudie Flakes, MD 11/18/19 2218

## 2019-11-18 NOTE — Discharge Instructions (Addendum)
You were evaluated in the Emergency Department and after careful evaluation, we did not find any emergent condition requiring admission or further testing in the hospital.  Your exam/testing today is overall reassuring.  Your symptoms may be due to cramping due to diarrhea or may be due to muscle strain or spasm.  You can use the Bentyl medication as needed for abdominal discomfort.  You can use the Robaxin medication for back pain.  Please do not take both medications at the same time.  Please return to the Emergency Department if you experience any worsening of your condition.  We encourage you to follow up with a primary care provider.  Thank you for allowing [place] to be a part of your care.

## 2019-11-18 NOTE — ED Triage Notes (Signed)
Pt states she has been having left sided flank/back pain since 1100am today. Pt denies any problems with urination. Pt does state she has been fighting constipation and loose stools.

## 2020-04-25 ENCOUNTER — Ambulatory Visit (HOSPITAL_COMMUNITY)
Admission: RE | Admit: 2020-04-25 | Discharge: 2020-04-25 | Disposition: A | Discharge status: Home / Self Care | Payer: Medicare Other | Source: Ambulatory Visit | Attending: Physician Assistant | Admitting: Physician Assistant

## 2020-04-25 ENCOUNTER — Ambulatory Visit: Payer: Medicare Other | Admitting: Physician Assistant

## 2020-04-25 ENCOUNTER — Encounter: Payer: Self-pay | Admitting: Physician Assistant

## 2020-04-25 ENCOUNTER — Other Ambulatory Visit: Payer: Self-pay

## 2020-04-25 ENCOUNTER — Ambulatory Visit: Payer: Self-pay

## 2020-04-25 DIAGNOSIS — M7989 Other specified soft tissue disorders: Secondary | ICD-10-CM | POA: Diagnosis not present

## 2020-04-25 DIAGNOSIS — R0602 Shortness of breath: Secondary | ICD-10-CM | POA: Diagnosis not present

## 2020-04-25 DIAGNOSIS — M25561 Pain in right knee: Secondary | ICD-10-CM | POA: Insufficient documentation

## 2020-04-25 MED ORDER — LIDOCAINE HCL 1 % IJ SOLN
5.0000 mL | INTRAMUSCULAR | Status: AC | PRN
  Administered 2020-04-25: 5 mL

## 2020-04-25 MED ORDER — METHYLPREDNISOLONE ACETATE 40 MG/ML IJ SUSP
40.0000 mg | INTRAMUSCULAR | Status: AC | PRN
  Administered 2020-04-25: 40 mg via INTRA_ARTICULAR

## 2020-04-25 NOTE — Progress Notes (Signed)
Office Visit Note   Patient: Sara Wells           Date of Birth: 13-Oct-1940           MRN: [id number] Visit Date: 04/25/2020              Requested by: Sara Pao, MD [address] [place],  [place] [address] PCP: Sara Casco Fransico Him, MD   Assessment & Plan: Visit Diagnoses:  1. Acute pain of right knee   2. Right leg swelling   3. Shortness of breath     Plan: We will send her for an ultrasound of her right leg due to the considerable swelling of the right leg compared to the left spine ongoing for the last month.  Also A. fib fact that she has had shortness of breath this developed over the last month.  In regards to the right knee see her back in 2 weeks to see what type of response she had to the aspiration and injection.  Questions were encouraged and answered.  Follow-Up Instructions: Return in about 2 weeks (around 05/09/2020).   Orders:  Orders Placed This Encounter  Procedures  . Large Joint Inj  . XR Knee 1-2 Views Right  . VAS [place] LOWER EXTREMITY VENOUS (DVT)   No orders of the defined types were placed in this encounter.     Procedures: Large Joint Inj: R knee on 04/25/2020 11:50 AM Indications: pain Details: 22 G 1.5 in needle, anterolateral approach  Arthrogram: No  Medications: 40 mg methylPREDNISolone acetate 40 MG/ML; 5 mL lidocaine 1 % Aspirate: 24 mL yellow Outcome: tolerated well, no immediate complications Procedure, treatment alternatives, risks and benefits explained, specific risks discussed. Consent was given by the patient. Immediately prior to procedure a time out was called to verify the correct patient, procedure, equipment, support staff and site/side marked as required. Patient was prepped and draped in the usual sterile fashion.       Clinical Data: No additional findings.   Subjective: Chief Complaint  Patient presents with  . Right Leg - Pain, Leg Swelling    HPI Sara Wells comes in today with right leg swelling for  the last month.  She also notes that she has been having pain in the right knee medial aspect for at least the last 3 months.  She did has a giving way sensation in the knee at times.  No recent injury.  She is having tenderness that radiates up to the medial right thigh.  No groin pain.  Has been taking Tylenol for the pain.  She has reportedly talked to her primary care physician about her shortness of breath and being given an inhaler.   Review of Systems Negative for fevers or chills.  Positive for shortness of breath and lower leg swelling right greater than left  Objective: Vital Signs: There were no vitals taken for this visit.  Physical Exam General: Well-developed well-nourished female no acute distress mood affect appropriate.   Psych alert and oriented x3. Ortho Exam Right knee good range of motion without pain.  McMurray's is negative bilaterally.  She has tenderness along the right knee medial joint line.  No abnormal warmth, ecchymosis or erythema also tenderness.  Positive effusion right knee.  In the popliteal region of the right knee.  Right leg with 2+ pitting edema and significantly larger than the left leg.  Left leg with nonpitting edema, no tenderness of the left calf. Specialty Comments:  No specialty  comments available.  Imaging: XR Knee 1-2 Views Right  Result Date: 04/25/2020 Right knee AP lateral views shows moderate narrowing medial joint line.  Mild lateral joint line narrowing.  Mild patellofemoral changes.  No acute fractures.  Positive effusion.  Knee is well located.  VAS [place] LOWER EXTREMITY VENOUS (DVT)  Result Date: 04/25/2020  Lower Venous DVTStudy Indications: Pain and Edema involving the right lower extremity x 3 months.  Performing Technologist: Ronal Fear RVS, RCS  Examination Guidelines: A complete evaluation includes B-mode imaging, spectral Doppler, color Doppler, and power Doppler as needed of all accessible portions of each vessel. Bilateral  testing is considered an integral part of a complete examination. Limited examinations for reoccurring indications may be performed as noted. The reflux portion of the exam is performed with the patient in reverse Trendelenburg.  +---------+---------------+---------+-----------+----------+--------------+ RIGHT    CompressibilityPhasicitySpontaneityPropertiesThrombus Aging +---------+---------------+---------+-----------+----------+--------------+ CFV      Full                                                        +---------+---------------+---------+-----------+----------+--------------+ SFJ      Full                                                        +---------+---------------+---------+-----------+----------+--------------+ FV Prox  Full                                                        +---------+---------------+---------+-----------+----------+--------------+ FV Mid   Full                                                        +---------+---------------+---------+-----------+----------+--------------+ FV DistalFull                                                        +---------+---------------+---------+-----------+----------+--------------+ POP      Full                                                        +---------+---------------+---------+-----------+----------+--------------+ PTV      Full                                                        +---------+---------------+---------+-----------+----------+--------------+ PERO     Full                                                        +---------+---------------+---------+-----------+----------+--------------+  GSV      Full                                                        +---------+---------------+---------+-----------+----------+--------------+ SSV      Full                                                         +---------+---------------+---------+-----------+----------+--------------+    Attempted to call report. No answer at [phone] at 11:45am.  Summary: RIGHT: - There is no evidence of deep vein thrombosis in the lower extremity. - There is no evidence of superficial venous thrombosis.  - A cystic structure is found in the popliteal fossa measuring approximately 2.60 cm x 3.71 cm.  LEFT: - No evidence of common femoral vein obstruction.  *See table(s) above for measurements and observations.    Preliminary      PMFS History: Patient Active Problem List   Diagnosis Date Noted  . Chronic right-sided low back pain with right-sided sciatica 02/25/2017  . Spondylolisthesis, lumbar region 02/25/2017  . Breast cancer ([place]) 10/05/2002   Past Medical History:  Diagnosis Date  . Arthritis    right hand  . Breast cancer ([place]) 10/05/2002   right  . Endometriosis   . GERD (gastroesophageal reflux disease)     Family History  Problem Relation Age of Onset  . Heart disease Brother   . Heart disease Brother   . Heart attack Mother   . Heart attack Father   . Colon cancer Neg Hx     Past Surgical History:  Procedure Laterality Date  . ABDOMINAL HYSTERECTOMY     age 65  . BREAST SURGERY  2004&2009   RIGHT & LEFT  . MOLE REMOVAL  number of years prior to 2004   early melanoma   Social History   Occupational History  . Not on file  Tobacco Use  . Smoking status: Never Smoker  . Smokeless tobacco: Never Used  Substance and Sexual Activity  . Alcohol use: No  . Drug use: No  . Sexual activity: Not on file

## 2020-05-09 ENCOUNTER — Encounter: Payer: Self-pay | Admitting: Physician Assistant

## 2020-05-09 ENCOUNTER — Ambulatory Visit (INDEPENDENT_AMBULATORY_CARE_PROVIDER_SITE_OTHER): Payer: Medicare Other | Admitting: Physician Assistant

## 2020-05-09 DIAGNOSIS — M25561 Pain in right knee: Secondary | ICD-10-CM | POA: Diagnosis not present

## 2020-05-09 NOTE — Progress Notes (Signed)
   Office Visit Note   Patient: Sara Wells           Date of Birth: 1941-07-12           MRN: [id number] Visit Date: 05/09/2020              Requested by: Sara Pao, MD [address] [place],  [place] [address] PCP: Sara Casco Fransico Him, MD   Assessment & Plan: Visit Diagnoses:  1. Acute pain of right knee     Plan: She will continue to work on quad strengthening remain.  Follow-up with [place] on as-needed basis.  Questions were encouraged and answered at length.  She will follow up also if she develops any mechanical symptoms or recurrent effusion.  Follow-Up Instructions: Return if symptoms worsen or fail to improve.   Orders:  No orders of the defined types were placed in this encounter.  No orders of the defined types were placed in this encounter.     Procedures: No procedures performed   Clinical Data: No additional findings.   Subjective: Chief Complaint  Patient presents with  . Right Knee - Follow-up    HPI Sara Wells returns today status post right knee injection aspiration 04/25/2020.  She states knee is overall doing well.  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  She otherwise has had no mechanical symptoms in the knee.  Still has some discomfort in the knee but it is much improved.  She is taking Tylenol for pain.  She did undergo a ultrasound of the right lower extremity rule out DVT and this is negative for DVT. Review of Systems Negative for fevers chills.  Objective: Vital Signs: There were no vitals taken for this visit.  Physical Exam General: Well-developed well-nourished female no acute distress mood and affect appropriate Ortho Exam Right lower extremity: Slight edema of the right lower leg compared to the left.  Calf supple nontender.  Right knee very mild effusion.  Otherwise good range of motion in the knee.  No instability valgus varus stressing. Specialty Comments:  No specialty comments  available.  Imaging: No results found.   PMFS History: Patient Active Problem List   Diagnosis Date Noted  . Chronic right-sided low back pain with right-sided sciatica 02/25/2017  . Spondylolisthesis, lumbar region 02/25/2017  . Breast cancer ([place]) 10/05/2002   Past Medical History:  Diagnosis Date  . Arthritis    right hand  . Breast cancer ([place]) 10/05/2002   right  . Endometriosis   . GERD (gastroesophageal reflux disease)     Family History  Problem Relation Age of Onset  . Heart disease Brother   . Heart disease Brother   . Heart attack Mother   . Heart attack Father   . Colon cancer Neg Hx     Past Surgical History:  Procedure Laterality Date  . ABDOMINAL HYSTERECTOMY     age 26  . BREAST SURGERY  2004&2009   RIGHT & LEFT  . MOLE REMOVAL  number of years prior to 2004   early melanoma   Social History   Occupational History  . Not on file  Tobacco Use  . Smoking status: Never Smoker  . Smokeless tobacco: Never Used  Substance and Sexual Activity  . Alcohol use: No  . Drug use: No  . Sexual activity: Not on file

## 2020-06-05 IMAGING — CT CT RENAL STONE PROTOCOL
2 of 4 series · 16 of 46 positions shown, 18 images · non-contrast
Comparison: None.

CLINICAL DATA: Left flank pain

EXAM:
CT ABDOMEN AND PELVIS WITHOUT CONTRAST
TECHNIQUE: Multidetector CT imaging of the abdomen and pelvis was performed
following the standard protocol without IV contrast.

[Series 2: axial st · axial · 0.83mm/px · z∈[-800,-405]mm · 13 of 91 slices shown, 15 images]
[im 6/91  soft-tissue]
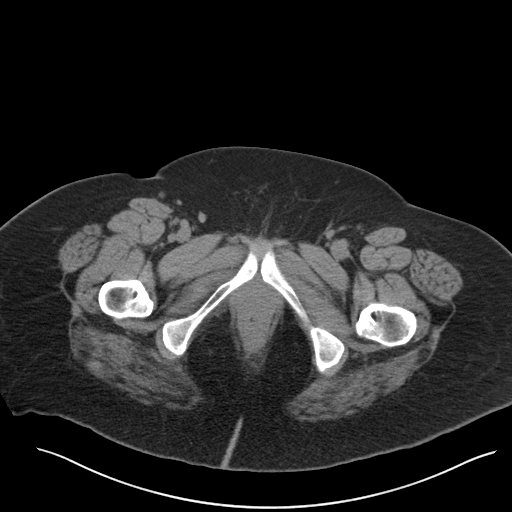
[im 6/91  bone]
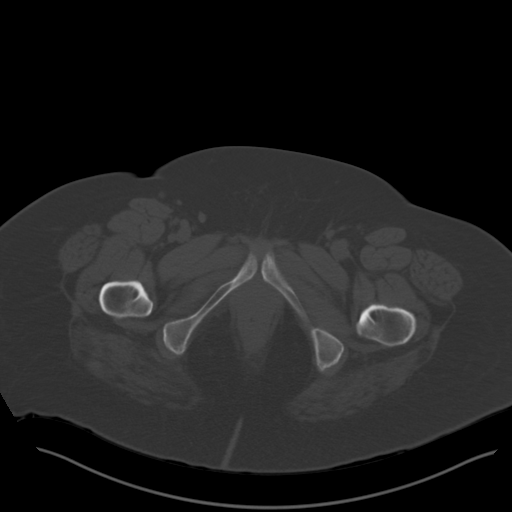
[im 11/91  soft-tissue]
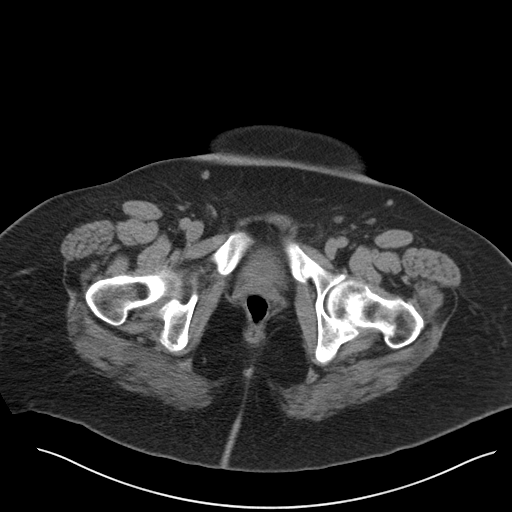
[im 22/91  soft-tissue]
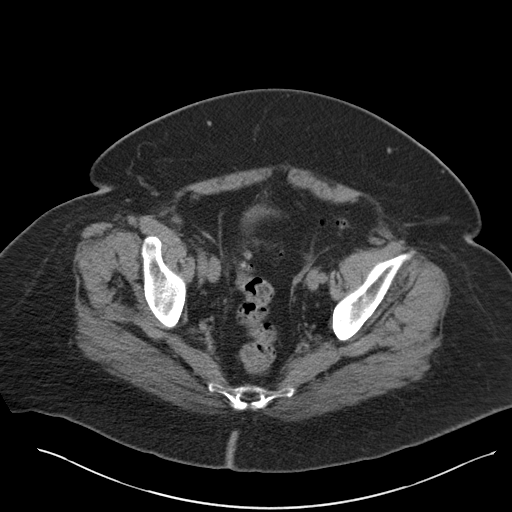
[im 27/91  soft-tissue]
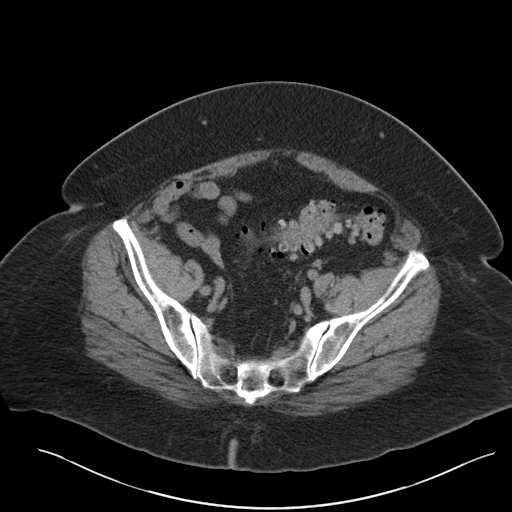
[im 32/91  soft-tissue]
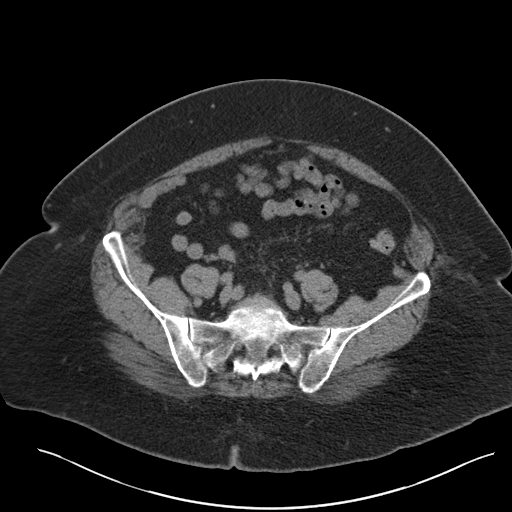
[im 38/91  soft-tissue]
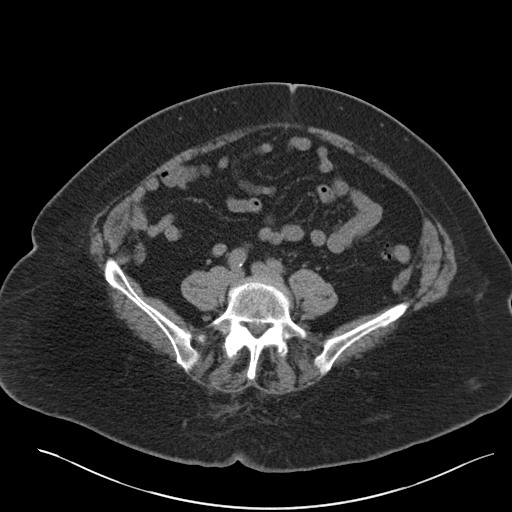
[im 48/91  soft-tissue]
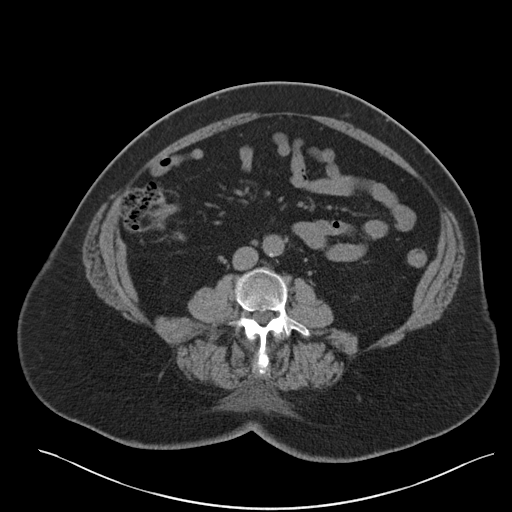
[im 53/91  soft-tissue]
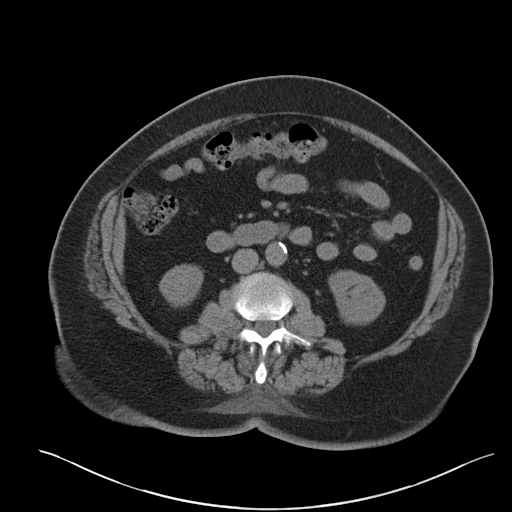
[im 59/91  soft-tissue]
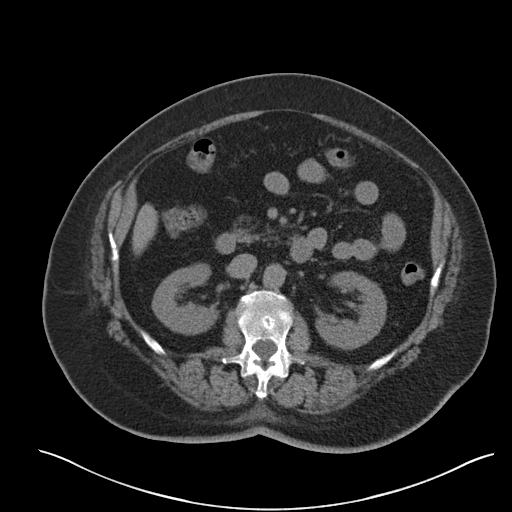
[im 59/91  bone]
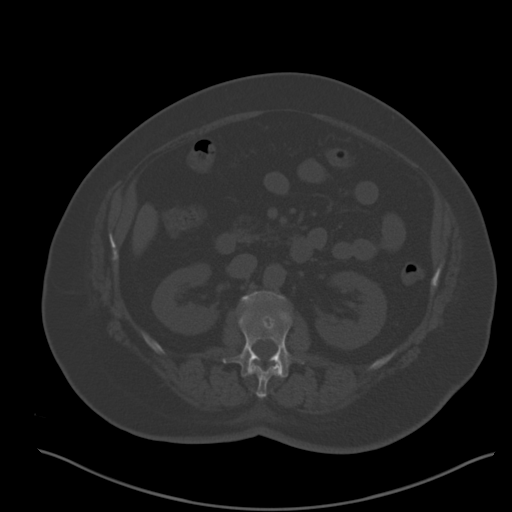
[im 64/91  soft-tissue]
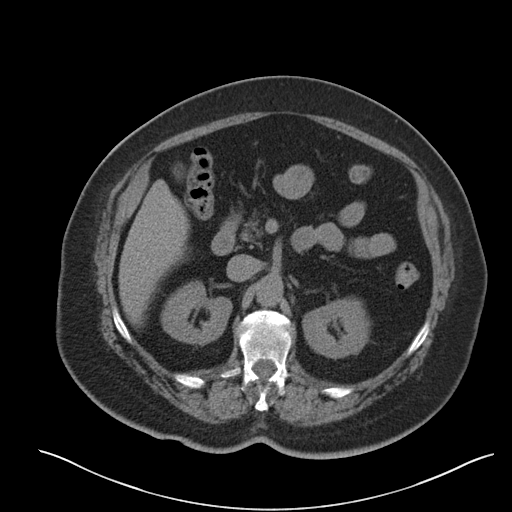
[im 69/91  soft-tissue]
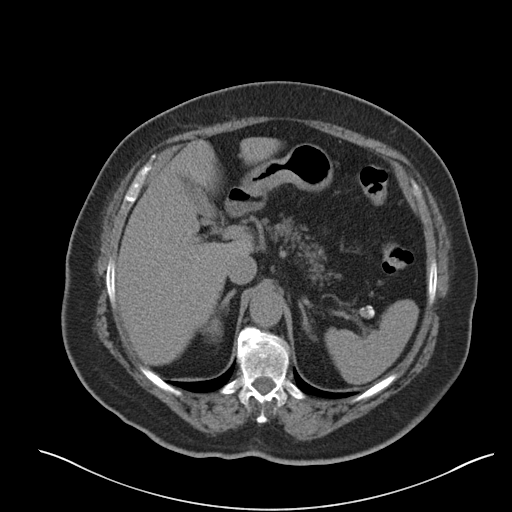
[im 80/91  soft-tissue]
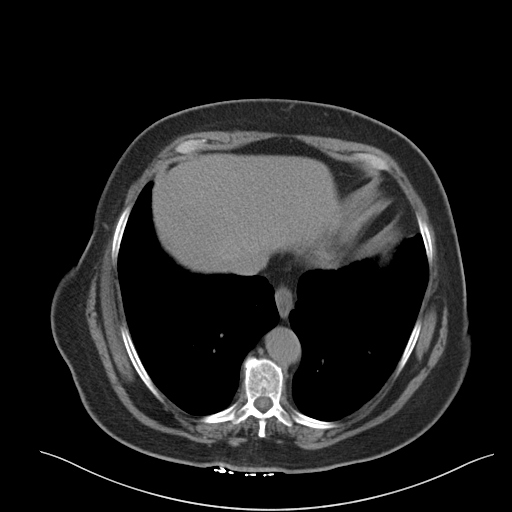
[im 85/91  soft-tissue]
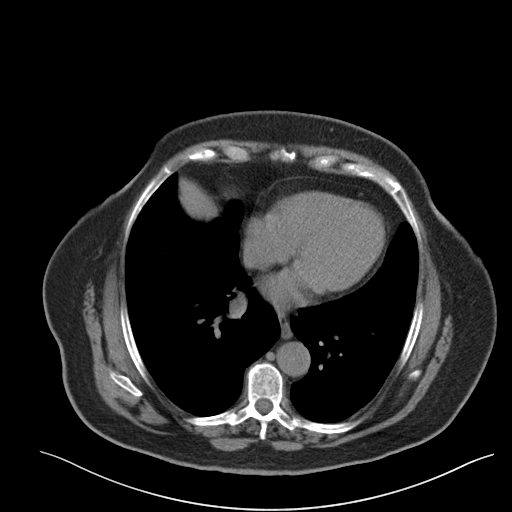

[Series 4: coronal st · coronal · 0.81mm/px · 3 of 159 slices shown]
[im 53/159  soft-tissue]
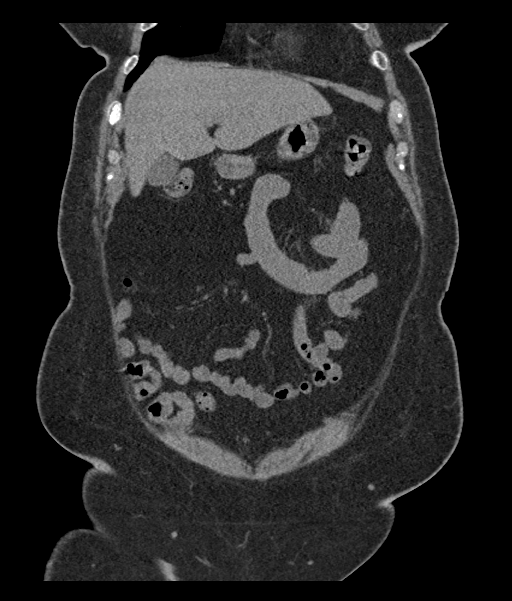
[im 71/159  soft-tissue]
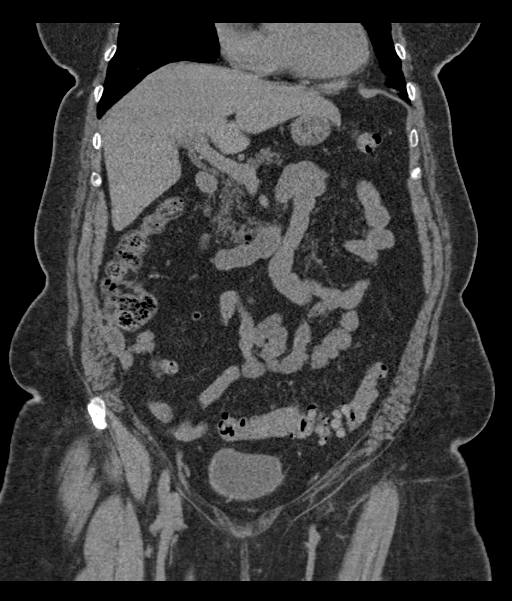
[im 88/159  soft-tissue]
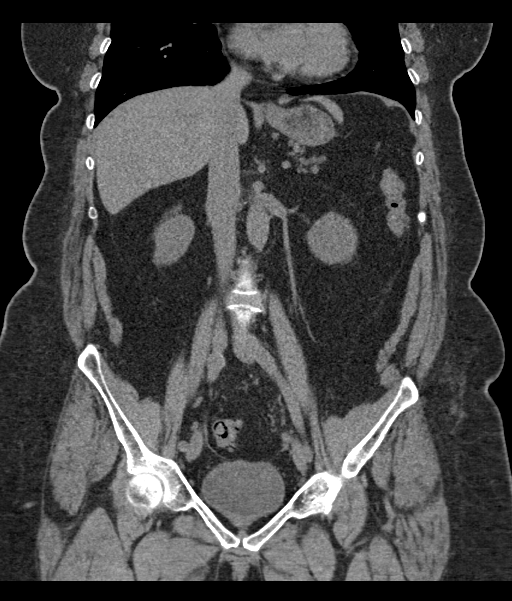

[16 of 46 positions shown; findings below may reference images not displayed]

FINDINGS: Lower chest: Lung bases demonstrate no acute consolidation or
pleural effusion. Cardiac size is within normal limits.

Hepatobiliary: No focal liver abnormality is seen. No gallstones,
gallbladder wall thickening, or biliary dilatation.

Pancreas: Unremarkable. No pancreatic ductal dilatation or
surrounding inflammatory changes.

Spleen: Normal in size without focal abnormality.

Adrenals/Urinary Tract: Adrenal glands are normal. No
hydronephrosis. Punctate stone in the lower pole of the left kidney.
The bladder is normal

Stomach/Bowel: Stomach is within normal limits. Appendix appears
normal. No evidence of bowel wall thickening, distention, or
inflammatory changes. Sigmoid and descending colon diverticular
disease without acute inflammatory changes.

Vascular/Lymphatic: Nonaneurysmal aorta. Mild aortic
atherosclerosis. No suspicious adenopathy

Reproductive: Status post hysterectomy. No adnexal masses.

Other: Negative for free air or free fluid

Musculoskeletal: No acute or significant osseous findings.
IMPRESSION: 1. Negative for hydronephrosis or ureteral stone. Punctate stone in
the left kidney
2. Left colon diverticular disease without acute inflammatory
process.

## 2021-03-03 ENCOUNTER — Other Ambulatory Visit: Payer: Self-pay | Admitting: Internal Medicine

## 2021-03-03 DIAGNOSIS — R1030 Lower abdominal pain, unspecified: Secondary | ICD-10-CM

## 2021-03-16 ENCOUNTER — Ambulatory Visit
Admission: RE | Admit: 2021-03-16 | Discharge: 2021-03-16 | Disposition: A | Discharge status: Home / Self Care | Payer: Medicare Other | Source: Ambulatory Visit | Attending: Internal Medicine | Admitting: Internal Medicine

## 2021-03-16 ENCOUNTER — Other Ambulatory Visit: Payer: Self-pay

## 2021-03-16 DIAGNOSIS — R1030 Lower abdominal pain, unspecified: Secondary | ICD-10-CM

## 2021-03-16 MED ORDER — IOPAMIDOL (ISOVUE-370) INJECTION 76%
80.0000 mL | Freq: Once | INTRAVENOUS | Status: AC | PRN
  Administered 2021-03-16: 80 mL via INTRAVENOUS

## 2021-03-21 ENCOUNTER — Other Ambulatory Visit: Payer: Medicare Other

## 2021-03-27 ENCOUNTER — Telehealth: Payer: Self-pay | Admitting: Cardiology

## 2021-03-27 NOTE — Telephone Encounter (Signed)
Pt called in and stated she as see  @ [facility] in [place] , [place]  C . Thur. 03-23-21 .  She would like to know If Dr Percival Spanish could look over her notes and see if she needs to come in to the office to fu?  Pt normally see Dr Percival Spanish in [place].     Best number for pt - [phone]

## 2021-03-27 NOTE — Telephone Encounter (Signed)
Called the patient back. She stated that she did not call today. She was last seen in 2019 and has an appointment in September to re-establish care.   The patient stated that she has not been seen at [facility].

## 2021-04-19 NOTE — Progress Notes (Signed)
Cardiology Office Note:    Date:  04/26/2021   ID:  Sara Wells, DOB Aug 05, 1941, MRN [id number]  PCP:  Haywood Pao, MD   [facility] Providers Cardiologist:  Minus Breeding, MD     Referring MD: Haywood Pao, MD   Evaluation of shortness of breath/dyspnea  History of Present Illness:    Sara Wells is a 80 y.o. female with a hx of breast CA, lumbar spondylolisthesis, and chronic right-sided low back pain.  Her PMH also includes hypertension, chronic left lower extremity edema and DOE.  She had a normal stress test 7/19.  She has been continued on her HCTZ and lisinopril.  She was instructed to hold HCTZ for blood pressure lower than [id number] systolic.  PFTs were ordered due to chronic dyspnea.  Her PFTs showed moderate obstructive airway changes.  She was advised to see her PCP for medication recommendations and referral to pulmonology.  She was seen by Bunnie Domino, PA-C 12/19.  During that time she reported that she felt somewhat better but still noted DOE.  At that time she had not seen her PCP for inhalers or referral to pulmonology.  She denied cardiac symptoms.  She was medically compliant.  She presents the clinic today for follow-up evaluation states for the last several months she has noticed increased lower extremity swelling and shortness of breath.  She reports she is unable to wear lower extremity support stockings because they are too tight.  She has been compliant with her furosemide.  She has only been taking her steroid inhaler as needed.  She has also been taking her albuterol inhaler as needed.  We reviewed the importance of twice daily use of her steroid inhaler and as needed use of her albuterol inhaler.  She expressed understanding.  I encouraged her to limit her fluid intake to 48 ounces.  I will increase her furosemide to 40 mg x 3 days and give supplemental potassium 20 mill equivalents x3 days then stop potassium supplementation.  We will order a  BMP in 1 week.  I have encouraged her to weigh her self daily and contact the office for weight increase of 3 pounds overnight or 5 pounds in 1 week.  We will have her follow-up in 1 month.  Today she denies chest pain, palpitations, melena, hematuria, hemoptysis, diaphoresis, weakness, presyncope, syncope, orthopnea, and PND.   Past Medical History:  Diagnosis Date   Arthritis    right hand   Breast cancer ([place]) 10/05/2002   right   Endometriosis    GERD (gastroesophageal reflux disease)     Past Surgical History:  Procedure Laterality Date   ABDOMINAL HYSTERECTOMY     age 89   BREAST SURGERY  2004&2009   RIGHT & LEFT   MOLE REMOVAL  number of years prior to 2004   early melanoma    Current Medications: Current Meds  Medication Sig   albuterol (PROVENTIL HFA;VENTOLIN HFA) 108 (90 Base) MCG/ACT inhaler Inhale 2 puffs into the lungs every 6 (six) hours as needed for wheezing or shortness of breath.   Cholecalciferol (VITAMIN D PO) Take by mouth. Reported on 07/26/2015   furosemide (LASIX) 20 MG tablet Take 20 mg by mouth daily.   losartan (COZAAR) 50 MG tablet Take 50 mg by mouth daily.   potassium chloride SA (KLOR-CON M20) 20 MEQ tablet Take 1 tablet (20 mEq total) by mouth daily for 3 days.   vitamin B-12 (CYANOCOBALAMIN) 1000 MCG tablet Take 1,000  mcg by mouth daily.     Allergies:   Codeine   Social History   Socioeconomic History   Marital status: Single    Spouse name: Not on file   Number of children: Not on file   Years of education: Not on file   Highest education level: Not on file  Occupational History   Not on file  Tobacco Use   Smoking status: Never   Smokeless tobacco: Never  Substance and Sexual Activity   Alcohol use: No   Drug use: No   Sexual activity: Not on file  Other Topics Concern   Not on file  Social History Narrative   Not on file   Social Determinants of Health   Financial Resource Strain: Not on file  Food Insecurity: Not on  file  Transportation Needs: Not on file  Physical Activity: Not on file  Stress: Not on file  Social Connections: Not on file     Family History: The patient's family history includes Heart attack in her father and mother; Heart disease in her brother and brother. There is no history of Colon cancer.  ROS:   Please see the history of present illness.     All other systems reviewed and are negative.   Risk Assessment/Calculations:           Physical Exam:    VS:  BP (!) 148/76   Pulse (P) 65   Ht '5\' 1"'$  (1.549 m)   Wt 210 lb 3.2 oz (95.3 kg)   BMI 39.72 kg/m     Wt Readings from Last 3 Encounters:  04/26/21 210 lb 3.2 oz (95.3 kg)  11/18/19 217 lb (98.4 kg)  07/14/18 209 lb 6.4 oz (95 kg)     GEN:  Well nourished, well developed in no acute distress HEENT: Normal NECK: No JVD; No carotid bruits LYMPHATICS: No lymphadenopathy CARDIAC: RRR, no murmurs, rubs, gallops, +1-2 bilateral lower extremity pitting edema RESPIRATORY:  Clear to auscultation without rales, wheezing or rhonchi  ABDOMEN: Soft, non-tender, non-distended MUSCULOSKELETAL:  No edema; No deformity  SKIN: Warm and dry NEUROLOGIC:  Alert and oriented x 3 PSYCHIATRIC:  Normal affect    EKGs/Labs/Other Studies Reviewed:    The following studies were reviewed today:  Echocardiogram 01/08/2018 Study Conclusions   - Left ventricle: The cavity size was normal. Systolic function was    normal. The estimated ejection fraction was 55%. Wall motion was    normal; there were no regional wall motion abnormalities. Doppler    parameters are consistent with both elevated ventricular    end-diastolic filling pressure and elevated left atrial filling    pressure.  - Mitral valve: There was mild regurgitation. Valve area by    pressure half-time: 2.06 cm^2.  - Left atrium: The atrium was mildly dilated.  - Atrial septum: No defect or patent foramen ovale was identified.  Nuclear stress test 02/21/2018  The  left ventricular ejection fraction is normal (55-65%). Nuclear stress EF: 59%. There was no ST segment deviation noted during stress. No T wave inversion was noted during stress. The study is normal. This is a low risk study.   Low risk stress nuclear study with normal perfusion and normal left ventricular regional and global systolic function. Note increased frequency of polymorphic PVCs and abnormal hypotensive response to exercise. Further evaluation for structural heart disease may be appropriate even with normal perfusion pattern.  EKG:  EKG is  ordered today.  The ekg ordered today demonstrates sinus  rhythm with premature atrial complexes possible anterior infarct undetermined age 61 bpm  Recent Labs: No results found for requested labs within last 8760 hours.  Recent Lipid Panel    Component Value Date/Time   CHOL 176 02/21/2018 0844   TRIG 90 02/21/2018 0844   HDL 60 02/21/2018 0844   CHOLHDL 2.9 02/21/2018 0844   CHOLHDL 3.7 02/25/2013 1001   VLDL 29 02/25/2013 1001   LDLCALC 98 02/21/2018 0844    ASSESSMENT & PLAN    Bilateral lower extremity edema/DOE-reports increased dyspnea with normal daily activities.  Previously felt that her DOE was a combination of her weight and abnormal PFTs which showed moderate obstruction.  Echocardiogram and stress test were normal 2019.  Details above.  Patient was instructed to follow-up with her PCP and obtain referral for pulmonology.  She was prescribed albuterol inhaler as needed. Continue albuterol, Breztri Heart healthy low-sodium diet-salty 6 given Increase physical activity as tolerated Increase furosemide to 40 mg daily x3 days then resume 20 mg dosing Start potassium 20 mill equivalents x3 days then stop Repeat BMP in 1 week Fluid restriction 48 ounces daily  Essential hypertension-BP today 150/76.  Well-controlled at home. Continue losartan Heart healthy low-sodium diet-salty 6 given Increase physical activity as  tolerated  Disposition: Follow-up with Dr. Percival Spanish in 1 to 2 months.       Medication Adjustments/Labs and Tests Ordered: Current medicines are reviewed at length with the patient today.  Concerns regarding medicines are outlined above.  Orders Placed This Encounter  Procedures   Basic metabolic panel   EKG [id number]   Meds ordered this encounter  Medications   potassium chloride SA (KLOR-CON M20) 20 MEQ tablet    Sig: Take 1 tablet (20 mEq total) by mouth daily for 3 days.    Dispense:  3 tablet    Refill:  0    Patient Instructions  Medication Instructions:   Increase Furosemide to 40 mg daily for three days; then go back to 20 mg daily.  Take Potassium 20 mEq daily for three days; then stop.  *If you need a refill on your cardiac medications before your next appointment, please call your pharmacy*   Lab Work: Your physician recommends that you return for lab work in 1 week: BMET  If you have labs (blood work) drawn today and your tests are completely normal, you will receive your results only by: [place] (if you have MyChart) OR A paper copy in the mail If you have any lab test that is abnormal or we need to change your treatment, we will call you to review the results.   Follow-Up: At [facility], you and your health needs are our priority.  As part of our continuing mission to provide you with exceptional heart care, we have created designated Provider Care Teams.  These Care Teams include your primary Cardiologist (physician) and Advanced Practice Providers (APPs -  Physician Assistants and Nurse Practitioners) who all work together to provide you with the care you need, when you need it.  We recommend signing up for the patient portal called "MyChart".  Sign up information is provided on this After Visit Summary.  MyChart is used to connect with patients for Virtual Visits (Telemedicine).  Patients are able to view lab/test results, encounter notes,  upcoming appointments, etc.  Non-urgent messages can be sent to your provider as well.   To learn more about what you can do with MyChart, go to [email or web address].    Your  next appointment:   1 month(s)  The format for your next appointment:   In Person  Provider:   Coletta Memos, NP or Laurann Montana, NP   Other Instructions Coletta Memos, NP has recommended weighing yourself daily and elevating your legs when you are not active. He has also recommended a fluid restriction of no more than 48 oz of fluid daily.   [facility] Weight Log   *Please call our office if you have weight gain of 3lbs overnight or 5lbs in a week.*  Date Time Weight                                                                  Signed, Deberah Pelton, NP  04/26/2021 4:52 PM      Notice: This dictation was prepared with Dragon dictation along with smaller phrase technology. Any transcriptional errors that result from this process are unintentional and may not be corrected upon review.  I spent 14 minutes examining this patient, reviewing medications, and using patient centered shared decision making involving her cardiac care.  Prior to her visit I spent greater than 20 minutes reviewing her past medical history,  medications, and prior cardiac tests.

## 2021-04-26 ENCOUNTER — Other Ambulatory Visit: Payer: Self-pay

## 2021-04-26 ENCOUNTER — Encounter (HOSPITAL_BASED_OUTPATIENT_CLINIC_OR_DEPARTMENT_OTHER): Payer: Self-pay | Admitting: General Practice

## 2021-04-26 ENCOUNTER — Ambulatory Visit (HOSPITAL_BASED_OUTPATIENT_CLINIC_OR_DEPARTMENT_OTHER): Payer: Medicare Other | Admitting: General Practice

## 2021-04-26 VITALS — BP 148/76 | Ht 61.0 in | Wt 210.2 lb

## 2021-04-26 DIAGNOSIS — I10 Essential (primary) hypertension: Secondary | ICD-10-CM

## 2021-04-26 DIAGNOSIS — R0602 Shortness of breath: Secondary | ICD-10-CM

## 2021-04-26 DIAGNOSIS — R600 Localized edema: Secondary | ICD-10-CM | POA: Diagnosis not present

## 2021-04-26 MED ORDER — POTASSIUM CHLORIDE CRYS ER 20 MEQ PO TBCR: 20.0000 meq | EXTENDED_RELEASE_TABLET | Freq: Every day | ORAL | 0 refills | Status: DC

## 2021-04-26 NOTE — Patient Instructions (Signed)
Medication Instructions:   Increase Furosemide to 40 mg daily for three days; then go back to 20 mg daily.  Take Potassium 20 mEq daily for three days; then stop.  *If you need a refill on your cardiac medications before your next appointment, please call your pharmacy*   Lab Work: Your physician recommends that you return for lab work in 1 week: BMET  If you have labs (blood work) drawn today and your tests are completely normal, you will receive your results only by: [place] (if you have MyChart) OR A paper copy in the mail If you have any lab test that is abnormal or we need to change your treatment, we will call you to review the results.   Follow-Up: At [facility], you and your health needs are our priority.  As part of our continuing mission to provide you with exceptional heart care, we have created designated Provider Care Teams.  These Care Teams include your primary Cardiologist (physician) and Advanced Practice Providers (APPs -  Physician Assistants and Nurse Practitioners) who all work together to provide you with the care you need, when you need it.  We recommend signing up for the patient portal called "MyChart".  Sign up information is provided on this After Visit Summary.  MyChart is used to connect with patients for Virtual Visits (Telemedicine).  Patients are able to view lab/test results, encounter notes, upcoming appointments, etc.  Non-urgent messages can be sent to your provider as well.   To learn more about what you can do with MyChart, go to [email or web address].    Your next appointment:   1 month(s)  The format for your next appointment:   In Person  Provider:   Coletta Memos, NP or Laurann Montana, NP   Other Instructions Coletta Memos, NP has recommended weighing yourself daily and elevating your legs when you are not active. He has also recommended a fluid restriction of no more than 48 oz of fluid daily.   [facility] Weight Log   *Please call our office if you have weight gain of 3lbs overnight or 5lbs in a week.*  Date Time Weight

## 2021-05-04 LAB — BASIC METABOLIC PANEL
BUN/Creatinine Ratio: 14 (ref 12–28)
BUN: 13 mg/dL (ref 8–27)
CO2: 26 mmol/L (ref 20–29)
Calcium: 9.4 mg/dL (ref 8.7–10.3)
Chloride: 100 mmol/L (ref 96–106)
Creatinine, Ser: 0.91 mg/dL (ref 0.57–1.00)
Glucose: 104 mg/dL — ABNORMAL HIGH (ref 70–99)
Potassium: 4.2 mmol/L (ref 3.5–5.2)
Sodium: 139 mmol/L (ref 134–144)
eGFR: 64 mL/min/{1.73_m2} (ref >59)

## 2021-05-23 ENCOUNTER — Other Ambulatory Visit: Payer: Self-pay

## 2021-05-23 ENCOUNTER — Encounter (HOSPITAL_BASED_OUTPATIENT_CLINIC_OR_DEPARTMENT_OTHER): Payer: Self-pay | Admitting: Family

## 2021-05-23 ENCOUNTER — Ambulatory Visit (HOSPITAL_BASED_OUTPATIENT_CLINIC_OR_DEPARTMENT_OTHER): Payer: Medicare Other | Admitting: Family

## 2021-05-23 VITALS — BP 140/68 | HR 66 | Ht 61.0 in | Wt 206.0 lb

## 2021-05-23 DIAGNOSIS — I10 Essential (primary) hypertension: Secondary | ICD-10-CM | POA: Diagnosis not present

## 2021-05-23 DIAGNOSIS — R0602 Shortness of breath: Secondary | ICD-10-CM | POA: Diagnosis not present

## 2021-05-23 DIAGNOSIS — R600 Localized edema: Secondary | ICD-10-CM

## 2021-05-23 MED ORDER — FUROSEMIDE 40 MG PO TABS: 40.0000 mg | ORAL_TABLET | Freq: Every day | ORAL | 3 refills | Status: DC

## 2021-05-23 NOTE — Progress Notes (Signed)
Office Visit    Patient Name: Sara Wells Date of Encounter: 05/23/2021  PCP:  Haywood Pao, MD   [place]  Cardiologist:  Minus Breeding, MD  Advanced Practice Provider:  No care team member to display Electrophysiologist:  None      Chief Complaint    Sara Wells is a 80 y.o. female with a hx of breast cancer, lumbar spondylolisthesis, chronic right sided low back pain, HTN, LE edema, dyspnea presents today for follow up of edema   Past Medical History    Past Medical History:  Diagnosis Date   Arthritis    right hand   Breast cancer ([place]) 10/05/2002   right   Endometriosis    GERD (gastroesophageal reflux disease)    Past Surgical History:  Procedure Laterality Date   ABDOMINAL HYSTERECTOMY     age 42   BREAST SURGERY  2004&2009   RIGHT & LEFT   MOLE REMOVAL  number of years prior to 2004   early melanoma    Allergies  Allergies  Allergen Reactions   Codeine Rash    History of Present Illness    Sara Wells is a 80 y.o. female with a hx of breast cancer, lumbar spondylolisthesis, chronic right sided low back pain, HTN, LE edema, dyspnea  last seen 04/26/21.  Prior normal stress test July 2019. Prior PFT with moderate obstructive airway changes.   See most recently 04/26/21 by Coletta Memos, NP with several month history of dyspnea an LE edema. Lasix was increased to 40mg  for 3 days then return to 20mg . She was only using her inhaler as needed rather than twice daily as prescribed.   Presents today for follow up. Weight down 4 lbs. Still with exertional dyspnea, LE edema. Drinks mostly soda such as [address] Mellow Yellow, Pepsi. Drinking 32 oz of fluid per day. Sits with legs down most of the time. Still only using inhaler as needed. We reviewed her PFTs and indication for twice daily inhaler use. She was concerned about "getting dependent" and we discussed benefits of inhaler at length. . Reports no chest pain,  pressure, or tightness. No edema, orthopnea, PND. Reports no palpitations.    EKGs/Labs/Other Studies Reviewed:   The following studies were reviewed today:  Echocardiogram 01/08/2018 Study Conclusions   - Left ventricle: The cavity size was normal. Systolic function was    normal. The estimated ejection fraction was 55%. Wall motion was    normal; there were no regional wall motion abnormalities. Doppler    parameters are consistent with both elevated ventricular    end-diastolic filling pressure and elevated left atrial filling    pressure.  - Mitral valve: There was mild regurgitation. Valve area by    pressure half-time: 2.06 cm^2.  - Left atrium: The atrium was mildly dilated.  - Atrial septum: No defect or patent foramen ovale was identified.   Nuclear stress test 02/21/2018   The left ventricular ejection fraction is normal (55-65%). Nuclear stress EF: 59%. There was no ST segment deviation noted during stress. No T wave inversion was noted during stress. The study is normal. This is a low risk study.   Low risk stress nuclear study with normal perfusion and normal left ventricular regional and global systolic function. Note increased frequency of polymorphic PVCs and abnormal hypotensive response to exercise. Further evaluation for structural heart disease may be appropriate even with normal perfusion pattern.  EKG:  None ordered today.  Recent Labs: 05/03/2021: BUN 13; Creatinine, Ser 0.91; Potassium 4.2; Sodium 139  Recent Lipid Panel    Component Value Date/Time   CHOL 176 02/21/2018 0844   TRIG 90 02/21/2018 0844   HDL 60 02/21/2018 0844   CHOLHDL 2.9 02/21/2018 0844   CHOLHDL 3.7 02/25/2013 1001   VLDL 29 02/25/2013 1001   LDLCALC 98 02/21/2018 0844    Home Medications   Current Meds  Medication Sig   albuterol (PROVENTIL HFA;VENTOLIN HFA) 108 (90 Base) MCG/ACT inhaler Inhale 2 puffs into the lungs every 6 (six) hours as needed for wheezing or shortness  of breath.   Budeson-Glycopyrrol-Formoterol (BREZTRI AEROSPHERE) 160-9-4.8 MCG/ACT AERO Inhale into the lungs as needed.   Cholecalciferol (VITAMIN D PO) Take by mouth. Reported on 07/26/2015   furosemide (LASIX) 20 MG tablet Take 20 mg by mouth daily.   hydrochlorothiazide (HYDRODIURIL) 12.5 MG tablet Take 12.5 mg by mouth as needed.   losartan (COZAAR) 50 MG tablet Take 50 mg by mouth daily.   potassium chloride SA (KLOR-CON M20) 20 MEQ tablet Take 1 tablet (20 mEq total) by mouth daily for 3 days.   vitamin B-12 (CYANOCOBALAMIN) 1000 MCG tablet Take 1,000 mcg by mouth daily.     Review of Systems      All other systems reviewed and are otherwise negative except as noted above.  Physical Exam    VS:  BP 140/68   Pulse 66   Ht 5\' 1"  (1.549 m)   Wt 206 lb (93.4 kg)   SpO2 97%   BMI 38.92 kg/m  , BMI Body mass index is 38.92 kg/m.  Wt Readings from Last 3 Encounters:  05/23/21 206 lb (93.4 kg)  04/26/21 210 lb 3.2 oz (95.3 kg)  11/18/19 217 lb (98.4 kg)     GEN: Well nourished, well developed, in no acute distress. HEENT: normal. Neck: Supple, no JVD, carotid bruits, or masses. Cardiac: RRR, no murmurs, rubs, or gallops. No clubbing, cyanosis. Bilateral 1+ pitting edema to lower extremities.  Radials/PT 2+ and equal bilaterally.  Respiratory:  Respirations regular and unlabored, clear to auscultation bilaterally. GI: Soft, nontender, nondistended. MS: No deformity or atrophy. Skin: Warm and dry, no rash. Neuro:  Strength and sensation are intact. Psych: Normal affect.  Assessment & Plan    LE edema / Dyspnea - Dyspnea multifactorial deconditioning, abnormal PFT. LE edema likely due to venous insufficiency, high salt diet. Offered echocardiogram which she politely declines. Detailed on education of importance of Breztri twice daily as she has been taking PRN. Increase Lasix to 40mg  QD. BMP in 2 weeks. Recommend follow up with primary care regarding PFT.  HTN - BP above  goal. Increase Lasix, as above. Home monitoring encouraged. If BP still not ag goal, consider further escalation of Losartan.  Disposition: Follow up in 2 month(s) with Dr. Percival Spanish or APP.  Signed, Loel Dubonnet, NP 05/23/2021, 2:26 PM  [facility]

## 2021-05-23 NOTE — Patient Instructions (Signed)
Medication Instructions:  Your physician has recommended you make the following change in your medication:    RECOMMEND taking your Breztri twice per day  CHANGE Furosemide to 40mg  daily *You can take two of your 20mg  tablets together in the morning  *If you need a refill on your cardiac medications before your next appointment, please call your pharmacy*   Lab Work: Your physician recommends that you return for lab work in 2 weeks for BMP at [facility]  If you have labs (blood work) drawn today and your tests are completely normal, you will receive your results only by: [employer] (if you have MyChart) OR A paper copy in the mail If you have any lab test that is abnormal or we need to change your treatment, we will call you to review the results.  Testing/Procedures: None ordered today.   Follow-Up: At [facility], you and your health needs are our priority.  As part of our continuing mission to provide you with exceptional heart care, we have created designated Provider Care Teams.  These Care Teams include your primary Cardiologist (physician) and Advanced Practice Providers (APPs -  Physician Assistants and Nurse Practitioners) who all work together to provide you with the care you need, when you need it.  We recommend signing up for the patient portal called "MyChart".  Sign up information is provided on this After Visit Summary.  MyChart is used to connect with patients for Virtual Visits (Telemedicine).  Patients are able to view lab/test results, encounter notes, upcoming appointments, etc.  Non-urgent messages can be sent to your provider as well.   To learn more about what you can do with MyChart, go to [email or web address].    Your next appointment:   2 month(s)  The format for your next appointment:   In Person  Provider:   You may see Minus Breeding, MD or one of the following Advanced Practice Providers on your designated Care Team:   Jory Sims, DNP, ANP Coletta Memos, NP Loel Dubonnet, NP    Other Instructions  Recommend weighing daily and keeping a log. Please call our office if you have weight gain of 2 pounds overnight or 5 pounds in 1 week.   Heart Healthy Diet Recommendations: A low-salt diet is recommended. Meats should be grilled, baked, or boiled. Avoid fried foods. Focus on lean protein sources like fish or chicken with vegetables and fruits. The American Heart Association is a Microbiologist!    Exercise recommendations: The American Heart Association recommends 150 minutes of moderate intensity exercise weekly. Try 30 minutes of moderate intensity exercise 4-5 times per week. This could include walking, jogging, or swimming.

## 2021-05-24 ENCOUNTER — Encounter (HOSPITAL_BASED_OUTPATIENT_CLINIC_OR_DEPARTMENT_OTHER): Payer: Self-pay | Admitting: Family

## 2021-06-08 LAB — BASIC METABOLIC PANEL
BUN/Creatinine Ratio: 12 (ref 12–28)
BUN: 12 mg/dL (ref 8–27)
CO2: 26 mmol/L (ref 20–29)
Calcium: 9.3 mg/dL (ref 8.7–10.3)
Chloride: 101 mmol/L (ref 96–106)
Creatinine, Ser: 0.97 mg/dL (ref 0.57–1.00)
Glucose: 162 mg/dL — ABNORMAL HIGH (ref 70–99)
Potassium: 3.7 mmol/L (ref 3.5–5.2)
Sodium: 141 mmol/L (ref 134–144)
eGFR: 59 mL/min/{1.73_m2} — ABNORMAL LOW (ref >59)

## 2021-08-03 NOTE — Progress Notes (Signed)
[facility] Note   Patient Name: IEISHA GAO Date of Encounter: 08/04/2021  Primary Care Provider:  Haywood Pao, MD Primary Cardiologist:  Minus Breeding, MD  Patient Profile  80 year old female with history of hypertension, chronic lower extremity edema, dyspnea on exertion status post PFTs with recommendation to use twice daily inhaler, with other history to include breast cancer, lumbar spondylolithiasis, chronic right-sided low back pain.   Past Medical History    Past Medical History:  Diagnosis Date   Arthritis    right hand   Breast cancer ([place]) 10/05/2002   right   Endometriosis    GERD (gastroesophageal reflux disease)    Past Surgical History:  Procedure Laterality Date   ABDOMINAL HYSTERECTOMY     age 50   BREAST SURGERY  2004&2009   RIGHT & LEFT   MOLE REMOVAL  number of years prior to 2004   early melanoma    Allergies  Allergies  Allergen Reactions   Codeine Rash    History of Present Illness    Mrs. Nile is a pleasant 80 year old female who presents on 19-month follow-up to evaluate chronic lower extremity edema, dyspnea, and hypertension.  On last office visit on 05/23/2021 with Laurann Montana, NP, Lasix was increased to 40 mg daily, she was advised to follow-up with PCP concerning abnormal PFTs requiring inhaler.  Blood pressure at that time was slightly elevated and she was encouraged on a low-sodium diet and to follow her blood pressures at home with increased dose of Lasix.  She comes today, feeling "dried out" and a little fatigued.  She does complain of occasional shortness of breath.  Does not use inhaler consistently as she does not feel that it is necessary to use each day, but will use it when she is short of breath or feeling as if her breathing is constricted.  She states that the inhaler helps tremendously.  Blood pressure has been a little low, and she has had a little bit of lightheadedness but no syncope or presyncope.  Home  Medications    Current Outpatient Medications  Medication Sig Dispense Refill   Cholecalciferol (VITAMIN D PO) Take by mouth. Reported on 07/26/2015     furosemide (LASIX) 40 MG tablet Take 1 tablet (40 mg total) by mouth daily. 90 tablet 3   losartan (COZAAR) 50 MG tablet Take 50 mg by mouth daily.     vitamin B-12 (CYANOCOBALAMIN) 1000 MCG tablet Take 1,000 mcg by mouth daily.     albuterol (PROVENTIL HFA;VENTOLIN HFA) 108 (90 Base) MCG/ACT inhaler Inhale 2 puffs into the lungs every 6 (six) hours as needed for wheezing or shortness of breath. (Patient not taking: Reported on 08/04/2021) 1 Inhaler 0   Budeson-Glycopyrrol-Formoterol (BREZTRI AEROSPHERE) 160-9-4.8 MCG/ACT AERO Inhale into the lungs as needed. (Patient not taking: Reported on 08/04/2021)     potassium chloride SA (KLOR-CON M20) 20 MEQ tablet Take 1 tablet (20 mEq total) by mouth daily for 3 days. 3 tablet 0   No current facility-administered medications for this visit.     Family History    Family History  Problem Relation Age of Onset   Heart disease Brother    Heart disease Brother    Heart attack Mother    Heart attack Father    Colon cancer Neg Hx    She indicated that her mother is deceased. She indicated that her father is deceased. She indicated that two of her four brothers are alive. She indicated that the  status of her neg hx is unknown.  Social History    Social History   Socioeconomic History   Marital status: Single    Spouse name: Not on file   Number of children: Not on file   Years of education: Not on file   Highest education level: Not on file  Occupational History   Not on file  Tobacco Use   Smoking status: Never   Smokeless tobacco: Never  Substance and Sexual Activity   Alcohol use: No   Drug use: No   Sexual activity: Not on file  Other Topics Concern   Not on file  Social History Narrative   Not on file   Social Determinants of Health   Financial Resource Strain: Not on file   Food Insecurity: Not on file  Transportation Needs: Not on file  Physical Activity: Not on file  Stress: Not on file  Social Connections: Not on file  Intimate Partner Violence: Not on file     Review of Systems    General:  No chills, fever, night sweats or weight changes.  Cardiovascular:  No chest pain, dyspnea on exertion, edema, orthopnea, palpitations, paroxysmal nocturnal dyspnea. Dermatological: No rash, lesions/masses Respiratory: No cough, occasional dyspnea Urologic: No hematuria, dysuria Abdominal:   No nausea, vomiting, diarrhea, bright red blood per rectum, melena, or hematemesis Neurologic:  No visual changes, wkns, changes in mental status.  Occasional dizziness All other systems reviewed and are otherwise negative except as noted above.     Physical Exam    VS:  BP (!) 108/56 (BP Location: Left Arm, Patient Position: Sitting, Cuff Size: Large)    Pulse 71    Ht 5\' 1"  (1.549 m)    Wt 200 lb 6.4 oz (90.9 kg)    SpO2 96%    BMI 37.87 kg/m  , BMI Body mass index is 37.87 kg/m.     GEN: Well nourished, well developed, in no acute distress. HEENT: normal. Neck: Supple, no JVD, carotid bruits, or masses. Cardiac: RRR, no murmurs, rubs, or gallops. No clubbing, cyanosis, edema.  Radials/DP/PT 2+ and equal bilaterally.  Respiratory:  Respirations regular and unlabored, clear to auscultation bilaterally. GI: Soft, nontender, nondistended, BS + x 4. MS: no deformity or atrophy. Skin: warm and dry, no rash. Neuro:  Strength and sensation are intact. Psych: Normal affect.  Accessory Clinical Findings    Lab Results  Component Value Date   WBC 12.8 (H) 11/18/2019   HGB 15.1 (H) 11/18/2019   HCT 46.7 (H) 11/18/2019   MCV 92.5 11/18/2019   PLT 201 11/18/2019   Lab Results  Component Value Date   CREATININE 0.97 06/07/2021   BUN 12 06/07/2021   NA 141 06/07/2021   K 3.7 06/07/2021   CL 101 06/07/2021   CO2 26 06/07/2021   Lab Results  Component Value Date    ALT 11 02/21/2018   AST 14 02/21/2018   ALKPHOS 53 02/21/2018   BILITOT 0.6 02/21/2018   Lab Results  Component Value Date   CHOL 176 02/21/2018   HDL 60 02/21/2018   LDLCALC 98 02/21/2018   TRIG 90 02/21/2018   CHOLHDL 2.9 02/21/2018    No results found for: HGBA1C  Review of Prior Studies: Echocardiogram 01/08/2018 Study Conclusions   - Left ventricle: The cavity size was normal. Systolic function was    normal. The estimated ejection fraction was 55%. Wall motion was    normal; there were no regional wall motion abnormalities. Doppler  parameters are consistent with both elevated ventricular    end-diastolic filling pressure and elevated left atrial filling    pressure.  - Mitral valve: There was mild regurgitation. Valve area by    pressure half-time: 2.06 cm^2.  - Left atrium: The atrium was mildly dilated.  - Atrial septum: No defect or patent foramen ovale was identified.   Nuclear stress test 02/21/2018   The left ventricular ejection fraction is normal (55-65%). Nuclear stress EF: 59%. There was no ST segment deviation noted during stress. No T wave inversion was noted during stress. The study is normal. This is a low risk study.   Low risk stress nuclear study with normal perfusion and normal left ventricular regional and global systolic function. Note increased frequency of polymorphic PVCs and abnormal hypotensive response to exercise. Further evaluation for structural heart disease may be appropriate even with normal perfusion pattern.  Assessment & Plan   1.  Hypertension: She is actually having a very soft blood pressure today with some associated dizziness.  She has been on Lasix 40 mg daily since being seen last due to lower extremity edema.  She is feeling a little "dried out" and therefore I am going to decrease her Lasix back to 20 mg daily.  If she begins to have some swelling we may need to alternate doses.  I am concerned about hypotension causing  her to pass out, or fall causing injury in this elderly woman.  I have given her parameters that if her blood pressure is less than 100/50 she is not to take the Lasix.  She is to take her blood pressure every day at the same time every day and report very low blood pressure or blood pressure greater than 140/80.  2. Dyspnea: Has occasional dyspnea, but no wheezing or coughing.  Does have an inhaler which he uses as needed.  She will continue to follow-up with PCP and/or pulmonologist as needed concerning this issue.  Most recent echocardiogram revealed normal LVEF with some elevation and end-diastolic pressure.  She we will continue the Lasix at lower dose.  Normal stress Myoview.  Current medicines are reviewed at length with the patient today.  I have spent 25 min's  dedicated to the care of this patient on the date of this encounter to include pre-visit review of records, assessment, management and diagnostic testing,with shared decision making.   Signed, Phill Myron. West Pugh, ANP, AACC   08/04/2021 1:18 PM    [facility] [address] Office [phone] Fax [phone]  Notice: This dictation was prepared with Dragon dictation along with smaller phrase technology. Any transcriptional errors that result from this process are unintentional and may not be corrected upon review.

## 2021-08-04 ENCOUNTER — Encounter: Payer: Self-pay | Admitting: Adult Health

## 2021-08-04 ENCOUNTER — Other Ambulatory Visit: Payer: Self-pay

## 2021-08-04 ENCOUNTER — Ambulatory Visit: Payer: Medicare Other | Admitting: Adult Health

## 2021-08-04 VITALS — BP 108/56 | HR 71 | Ht 61.0 in | Wt 200.4 lb

## 2021-08-04 DIAGNOSIS — R0689 Other abnormalities of breathing: Secondary | ICD-10-CM

## 2021-08-04 DIAGNOSIS — I10 Essential (primary) hypertension: Secondary | ICD-10-CM | POA: Diagnosis not present

## 2021-08-04 DIAGNOSIS — R0600 Dyspnea, unspecified: Secondary | ICD-10-CM | POA: Insufficient documentation

## 2021-08-04 DIAGNOSIS — J4520 Mild intermittent asthma, uncomplicated: Secondary | ICD-10-CM | POA: Insufficient documentation

## 2021-08-04 MED ORDER — FUROSEMIDE 20 MG PO TABS: 20.0000 mg | ORAL_TABLET | Freq: Every day | ORAL | 3 refills | Status: DC

## 2021-08-04 NOTE — Patient Instructions (Signed)
Medication Instructions:  Take furosemide 20 mg each day.  *If you need a refill on your cardiac medications before your next appointment, please call your pharmacy*   Follow-Up: At [facility], you and your health needs are our priority.  As part of our continuing mission to provide you with exceptional heart care, we have created designated Provider Care Teams.  These Care Teams include your primary Cardiologist (physician) and Advanced Practice Providers (APPs -  Physician Assistants and Nurse Practitioners) who all work together to provide you with the care you need, when you need it.  We recommend signing up for the patient portal called "MyChart".  Sign up information is provided on this After Visit Summary.  MyChart is used to connect with patients for Virtual Visits (Telemedicine).  Patients are able to view lab/test results, encounter notes, upcoming appointments, etc.  Non-urgent messages can be sent to your provider as well.   To learn more about what you can do with MyChart, go to [email or web address].    Your next appointment:   February 02, 2022 at 12:20 pm   The format for your next appointment:   In Person  Provider:   Minus Breeding, MD

## 2021-08-30 ENCOUNTER — Encounter: Payer: Self-pay | Admitting: Internal Medicine

## 2021-09-06 ENCOUNTER — Encounter: Payer: Self-pay | Admitting: Physician Assistant

## 2021-09-14 NOTE — Progress Notes (Signed)
09/15/2021 Sara Wells [id number] [birth year]   ASSESSMENT AND PLAN:   Diverticulosis of colon with hemorrhage  No symptoms of diverticulitis  Discussed pathophysiology  Add on fiber supplement  Hepatic steatosis Monitor LFTs q 6 months, weight loss advised  Chronic idiopathic constipation -     Wheat Dextrin (BENEFIBER) POWD; Use as directed, daily -     polyethylene glycol (MIRALAX) 17 g packet; Take 17 g by mouth daily. -     DG Abd 1 View; Future  Lower abdominal pain Chronic lower abdominal pain for 1 year, normal CT abdomen pelvis 11 diverticulosis what appears to be stool burden though not mention. Normal colonoscopy 2014 Patient tends to have more constipation, has had some loose stools, most likely this is overflow incontinence, possible component of pelvic floor dysfunction with medications. We will get KUB to evaluate stool burden, start on MiraLAX Benefiber, get CBC and Hemoccult to evaluate for GI occult blood loss. If this does not help can consider colonoscopy next visit. Also patient does have some bilateral hip pain, will send note to PCP to consider hip x-ray, lower back x-ray as pain is worse with movement and better with Tylenol. -     CBC with Differential/Platelet; Future -     Hemoccult Cards (X3 cards); Future -     Hemoccult Cards (X3 cards); Future     Future Appointments  Date Time Provider [place]  02/02/2022 12:20 PM Minus Breeding, MD [facility]    Patient Care Team: Tisovec, Fransico Him, MD as PCP - General (Internal Medicine) Minus Breeding, MD as PCP - Cardiology (Cardiology)  HISTORY OF PRESENT ILLNESS: 81 y.o. female referred by Tisovec, Fransico Him, MD presents for evaluation of Abdominal pain.  She has medical history significant for breast cancer, chronic back pain, dyspnea, hypertension. Previous AB hysterectomy, she has no kids.   She has had lower AB pain for a year, worse in last 6 months.  Intermittent,  but every day when she sits up in the bed in the morning will have lower suprapubic pain and worse with walking. She has never had regular BM's in the past, normally every other day or every 2 days, soft stools at that time. She states in last 8 months was having looser stools, then she states past 2 weeks has had more constipation, more hard stools, very small yesterday.Still passing gas.  No nausea, vomiting. No weight loss.  Will have very rare small volume pink spots on TP with wiping if she strains, otherwise no hematochezia or melena.   Pain can be better with tylenol.  She had CT AB and pelvis 03/2021 personally reviewed with diverticulosis and does appear to have stool burden not mentioned.  Last colon was 2014 with Dr. Olevia Perches which was normal.   External labs and notes reviewed this visit: 08/29/2021 BUN 14, creatinine 0.8, potassium 3.9, albumin 3.7, AST 18, ALT 17, alk phos 69, total bilirubin 0.9, A1c 5.8, Hemosure negative - 03/01/2021  03/16/2021 CT AB and pelvis with contrast for AB/pelvic pain x 6 months Severe sigmoid diverticulosis. No radiographic evidence of diverticulitis or other acute findings. Mild hepatic steatosis. 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 Aortic Atherosclerosis (ICD10-I70.0).  colonoscopy12/17/2014, Dr. Delfin Edis showed normal colon other than mild diverticulosis in descending and sigmoid colon. 07/09/2003 colonoscopy  normal  Current Medications:    Current Outpatient Medications (Cardiovascular):    furosemide (LASIX) 20 MG tablet, Take 1 tablet (20 mg total) by mouth  daily.   losartan (COZAAR) 50 MG tablet, Take 50 mg by mouth daily.    Current Outpatient Medications (Hematological):    vitamin B-12 (CYANOCOBALAMIN) 1000 MCG tablet, Take 1,000 mcg by mouth daily.  Current Outpatient Medications (Other):    Cholecalciferol (VITAMIN D PO), Take by mouth. Reported on 07/26/2015   polyethylene glycol (MIRALAX) 17 g packet, Take  17 g by mouth daily.   Wheat Dextrin (BENEFIBER) POWD, Use as directed, daily  Medical History:  Past Medical History:  Diagnosis Date   Arthritis    right hand   Breast cancer ([place]) 10/05/2002   right   Endometriosis    GERD (gastroesophageal reflux disease)    Allergies:  Allergies  Allergen Reactions   Codeine Rash     Surgical History:  She  has a past surgical history that includes Breast surgery (2004&2009); Abdominal hysterectomy; and Mole removal (number of years prior to 2004). Family History:  Her family history includes Heart attack in her father and mother; Heart disease in her brother and brother. Social History:   reports that she has never smoked. She has never used smokeless tobacco. She reports that she does not drink alcohol and does not use drugs.  REVIEW OF SYSTEMS  : All other systems reviewed and negative except where noted in the History of Present Illness.   PHYSICAL EXAM: BP 118/70    Pulse 69    Ht '5\' 1"'  (1.549 m)    Wt 196 lb 4.8 oz (89 kg)    SpO2 97%    BMI 37.09 kg/m  General:   Pleasant, well developed female in no acute distress Head:  Normocephalic and atraumatic. Eyes: sclerae anicteric,conjunctive pink  Heart:  regular rate and rhythm Pulm: Clear anteriorly; no wheezing Abdomen:  Soft, Obese AB, skin exam normal, Normal bowel sounds. mild tenderness in the lower abdomen. Without guarding and Without rebound, without hepatomegaly. Extremities:  With edema, wearing compression socks.  Msk:  Symmetrical without gross deformities. + pain bilateral hip, left great than right with rotation, negative straight leg raise.  Peripheral pulses intact.  Neurologic:  Alert and  oriented x4;  grossly normal neurologically. Skin:   Dry and intact without significant lesions or rashes. Psychiatric: Demonstrates good judgement and reason without abnormal affect or behaviors.   Vladimir Crofts, PA-C 11:05 AM

## 2021-09-15 ENCOUNTER — Other Ambulatory Visit (INDEPENDENT_AMBULATORY_CARE_PROVIDER_SITE_OTHER): Payer: Medicare Other

## 2021-09-15 ENCOUNTER — Encounter: Payer: Self-pay | Admitting: Physician Assistant

## 2021-09-15 ENCOUNTER — Ambulatory Visit: Payer: Medicare Other | Admitting: Physician Assistant

## 2021-09-15 VITALS — BP 118/70 | HR 69 | Ht 61.0 in | Wt 196.3 lb

## 2021-09-15 DIAGNOSIS — K5731 Diverticulosis of large intestine without perforation or abscess with bleeding: Secondary | ICD-10-CM

## 2021-09-15 DIAGNOSIS — R1030 Lower abdominal pain, unspecified: Secondary | ICD-10-CM | POA: Diagnosis not present

## 2021-09-15 DIAGNOSIS — K760 Fatty (change of) liver, not elsewhere classified: Secondary | ICD-10-CM | POA: Diagnosis not present

## 2021-09-15 DIAGNOSIS — K5904 Chronic idiopathic constipation: Secondary | ICD-10-CM | POA: Diagnosis not present

## 2021-09-15 LAB — CBC WITH DIFFERENTIAL/PLATELET
Basophils Absolute: 0 10*3/uL (ref 0.0–0.1)
Basophils Relative: 0.6 % (ref 0.0–3.0)
Eosinophils Absolute: 0.1 10*3/uL (ref 0.0–0.7)
Eosinophils Relative: 1.2 % (ref 0.0–5.0)
HCT: 44.7 % (ref 36.0–46.0)
Hemoglobin: 14.8 g/dL (ref 12.0–15.0)
Lymphocytes Relative: 27.2 % (ref 12.0–46.0)
Lymphs Abs: 1.9 10*3/uL (ref 0.7–4.0)
MCHC: 33.2 g/dL (ref 30.0–36.0)
MCV: 89.6 fl (ref 78.0–100.0)
Monocytes Absolute: 0.6 10*3/uL (ref 0.1–1.0)
Monocytes Relative: 8.5 % (ref 3.0–12.0)
Neutro Abs: 4.4 10*3/uL (ref 1.4–7.7)
Neutrophils Relative %: 62.5 % (ref 43.0–77.0)
Platelets: 194 10*3/uL (ref 150.0–400.0)
RBC: 4.99 Mil/uL (ref 3.87–5.11)
RDW: 14.4 % (ref 11.5–15.5)
WBC: 7 10*3/uL (ref 4.0–10.5)

## 2021-09-15 MED ORDER — BENEFIBER PO POWD: ORAL | 0 refills | Status: AC

## 2021-09-15 MED ORDER — POLYETHYLENE GLYCOL 3350 17 G PO PACK: 17.0000 g | PACK | Freq: Every day | ORAL | 3 refills | Status: AC

## 2021-09-15 NOTE — Patient Instructions (Addendum)
Recommend starting on a fiber supplement, can try metamucil first but if this causes gas/bloating switch to benefiber- Take with with a full 8 oz glass of water once a day. This can take 1 month to start helping, so try for at least one month.  Recommend increasing water and activity.  Get a squatty potty to use at home or try a stool, goal is to get your knees above your hips during a bowel movement.  Miralax is an osmotic laxative.  It only brings more water into the stool.  This is safe to take daily.  Can take up to 17 gram of miralax twice a day.  Mix with juice or coffee.  Start 1 capful at night for 3-4 days and reassess your response in 3-4 days.  You can increase and decrease the dose based on your response.  Remember, it can take up to 3-4 days to take effect OR for the effects to wear off.   I often pair this with benefiber in the morning to help assure the stool is not too loose.   Suggest seeing PCP to get hips/lower back evaluated.   Your provider has requested that you go to the basement level for lab work before leaving today. Press "B" on the elevator. The lab is located at the first door on the left as you exit the elevator.   Go to the ER if unable to pass gas, severe AB pain, unable to hold down food, any shortness of breath of chest pain.    Constipation, Adult Constipation is when a person has fewer than three bowel movements in a week, has difficulty having a bowel movement, or has stools (feces) that are dry, hard, or larger than normal. Constipation may be caused by an underlying condition. It may become worse with age if a person takes certain medicines and does not take in enough fluids. Follow these instructions at home: Eating and drinking  Eat foods that have a lot of fiber, such as beans, whole grains, and fresh fruits and vegetables. Limit foods that are low in fiber and high in fat and processed sugars, such as fried or sweet foods. These include french  fries, hamburgers, cookies, candies, and soda. Drink enough fluid to keep your urine pale yellow. General instructions Exercise regularly or as told by your health care provider. Try to do 150 minutes of moderate exercise each week. Use the bathroom when you have the urge to go. Do not hold it in. Take over-the-counter and prescription medicines only as told by your health care provider. This includes any fiber supplements. During bowel movements: Practice deep breathing while relaxing the lower abdomen. Practice pelvic floor relaxation. Watch your condition for any changes. Let your health care provider know about them. Keep all follow-up visits as told by your health care provider. This is important. Contact a health care provider if: You have pain that gets worse. You have a fever. You do not have a bowel movement after 4 days. You vomit. You are not hungry or you lose weight. You are bleeding from the opening between the buttocks (anus). You have thin, pencil-like stools. Get help right away if: You have a fever and your symptoms suddenly get worse. You leak stool or have blood in your stool. Your abdomen is bloated. You have severe pain in your abdomen. You feel dizzy or you faint. Summary Constipation is when a person has fewer than three bowel movements in a week, has difficulty having a  bowel movement, or has stools (feces) that are dry, hard, or larger than normal. Eat foods that have a lot of fiber, such as beans, whole grains, and fresh fruits and vegetables. Drink enough fluid to keep your urine pale yellow. Take over-the-counter and prescription medicines only as told by your health care provider. This includes any fiber supplements. This information is not intended to replace advice given to you by your health care provider. Make sure you discuss any questions you have with your health care provider. Document Revised: 06/10/2019 Document Reviewed: 06/10/2019 Elsevier  Patient Education  2022 [place] you for choosing me and [facility].  Vicie Mutters PA

## 2021-09-15 NOTE — Progress Notes (Signed)
Agree with assessment and plan as outlined.  

## 2021-09-20 ENCOUNTER — Other Ambulatory Visit (INDEPENDENT_AMBULATORY_CARE_PROVIDER_SITE_OTHER): Payer: Medicare Other

## 2021-09-20 DIAGNOSIS — K5904 Chronic idiopathic constipation: Secondary | ICD-10-CM | POA: Diagnosis not present

## 2021-09-20 DIAGNOSIS — R1030 Lower abdominal pain, unspecified: Secondary | ICD-10-CM

## 2021-09-20 LAB — HEMOCCULT SLIDES (X 3 CARDS)
Fecal Occult Blood: NEGATIVE
OCCULT 1: NEGATIVE
OCCULT 2: NEGATIVE
OCCULT 3: NEGATIVE
OCCULT 4: NEGATIVE
OCCULT 5: NEGATIVE

## 2021-12-06 NOTE — Progress Notes (Signed)
? ? ?12/13/2021 ?Sara Wells ?[id number] ?[birth year] ? ?Referring provider: Tisovec, Fransico Him, MD ?Primary GI doctor: Dr. Luvenia Starch ? ?ASSESSMENT AND PLAN:  ? ?Postmenopausal vaginal bleeding ?-     Ambulatory referral to Obstetrics / Gynecology ?She describes small volume BRB on TP after urinating at times with  urinary incontinence, constipation, suprapubic discomfort.  ?CT AB 03/2021 normal ?Possible prolapse/vaginal atrophy as cause but will refer to GYN to [facility] out endometrial cancer ?No weight loss, no night sweats ? 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  ?Will treat with miralax/fiber, squatty potty.  ?May have to add on stimulant.  ?Can refer to pelvic floor PT after GYN referral.  ?Will get KUB ? ?Lower abdominal pain ?-     DG Abd 1 View; Future ?-     Ambulatory referral to Obstetrics / Gynecology ?Chronic, more suprapubic and worse with standing some left hip adductor pain.  ?Will refer to GYN, talk with PCP about possible adductor/hip, may need xray.  ?Due for colonoscopy 07/2023, no blood in stool, no anemia, will evaluate with GYN first, if any changes or not improving consider sooner colonoscopy. Will follow up 4-6 months after seeing GYN, will call sooner if any changes.  ? ?Hepatic steatosis ?--Continue to work on risk factor modification including diet exercise and control of risk factors including blood sugars. ?- monitor LFTs q 6 months.  ? ?Diverticulosis of colon with hemorrhage ?Will call if any symptoms. ?Add on fiber supplement, avoid NSAIDS, information given ? ? ?History of Present Illness:  ?81 y.o. female  with a past medical history of breast cancer, chronic back pain, dyspnea, hypertension. Previous AB hysterectomy, she has no kids and others listed below, returns to clinic today for evaluation of lower abdominal pain. ? ?Patient  last seen in the office 09/15/2021 for chronic abdominal pain for over a year getting worse last 6 months.   ?Had normal CT abdomen pelvis on 03/16/2021 other than diverticulosis, hepatic steatosis. ?Supposed to get KUB to evaluate stool burden never got this. ?Negative Hemoccult cards, CBC without anemia or infection. ?Added on fiber, MiraLAX follow-up this visit for possibility of scheduling colonoscopy sooner than 2024. ? ?States she has noticed the pain is more suprapubic.  ?Worse when she first stands and starts to walk.  ?Still has intermittent constipation/diarrhea, will have 2-3 days without going and then loose stools.  ?She has nocturia, she has urinary incontinence.  ?No vaginal pain, feels nothing falling out.  ?She has seen some small spots of BRB on TP after peeing.  ? ?03/16/2021 CT AB and pelvis with contrast for AB/pelvic pain x 6 months ?Severe sigmoid diverticulosis. No radiographic evidence of ?diverticulitis or other acute findings. ?Mild hepatic steatosis. 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 ?Aortic Atherosclerosis (ICD10-I70.0). ?  ?Colonoscopy 07/22/2013, ?Dr. Delfin Edis showed normal colon other than mild diverticulosis in descending and sigmoid colon. ?Colonoscopy 07/09/2003   ?normal ? ?Current Medications:  ? ? ?Current Outpatient Medications (Cardiovascular):  ?  hydrochlorothiazide (HYDRODIURIL) 12.5 MG tablet, Take 12.5 mg by mouth daily. ?  lisinopril (ZESTRIL) 5 MG tablet, Take by mouth. ?  losartan (COZAAR) 50 MG tablet, Take 50 mg by mouth daily. ?  furosemide (LASIX) 20 MG tablet, Take 1 tablet (20 mg total) by mouth daily. ? ? ? ?Current Outpatient Medications (Hematological):  ?  vitamin B-12 (CYANOCOBALAMIN) 1000 MCG tablet, Take 1,000 mcg by mouth daily. ? ?Current Outpatient Medications (Other):  ?  D 1000 25 MCG (1000 UT) capsule, SMARTSIG:1 By Mouth ?  Wheat Dextrin (BENEFIBER) POWD, Use as directed, daily ? ?Surgical History:  ?She  has a past surgical  history that includes Breast surgery (2004&2009); Abdominal hysterectomy; and Mole removal (number of years prior to 2004). ?Family History:  ?Her family history includes Heart attack in her father and mother; Heart disease in her brother and brother. ?Social History:  ? reports that she has never smoked. She has never used smokeless tobacco. She reports that she does not drink alcohol and does not use drugs. ? ?Current Medications, Allergies, Past Medical History, Past Surgical History, Family History and Social History were reviewed in [employer] record. ? ?Physical Exam: ?BP 132/78   Pulse 68   Resp (!) 98   Ht '5\' 4"'$  (1.626 m)   Wt 195 lb (88.5 kg)   BMI 33.47 kg/m?  ?General:   Pleasant, well developed female in no acute distress ?Heart : Regular rate and rhythm; no murmurs ?Pulm: Clear anteriorly; no wheezing ?Abdomen:  Soft, Obese AB, Active bowel sounds. mild tenderness in the lower abdomen/suprapubicWithout guarding and Without rebound, No organomegaly appreciated. ?Extremities:  without  edema, some mild left adductor hip pain and some mild pain with rotation.  ?Neurologic:  Alert and  oriented x4;  No focal deficits.  ?Psych:  Cooperative. Normal mood and affect. ? ? ?Vladimir Crofts, PA-C ?12/13/21 ?

## 2021-12-11 ENCOUNTER — Other Ambulatory Visit: Payer: Self-pay

## 2021-12-13 ENCOUNTER — Encounter: Payer: Self-pay | Admitting: Physician Assistant

## 2021-12-13 ENCOUNTER — Ambulatory Visit (INDEPENDENT_AMBULATORY_CARE_PROVIDER_SITE_OTHER)
Admission: RE | Admit: 2021-12-13 | Discharge: 2021-12-13 | Disposition: A | Discharge status: Home / Self Care | Payer: Medicare Other | Source: Ambulatory Visit | Attending: Physician Assistant | Admitting: Physician Assistant

## 2021-12-13 ENCOUNTER — Ambulatory Visit: Payer: Medicare Other | Admitting: Physician Assistant

## 2021-12-13 ENCOUNTER — Other Ambulatory Visit: Payer: Self-pay

## 2021-12-13 VITALS — BP 132/78 | HR 68 | Resp 98 | Ht 64.0 in | Wt 195.0 lb

## 2021-12-13 DIAGNOSIS — N950 Postmenopausal bleeding: Secondary | ICD-10-CM

## 2021-12-13 DIAGNOSIS — R1030 Lower abdominal pain, unspecified: Secondary | ICD-10-CM | POA: Diagnosis not present

## 2021-12-13 DIAGNOSIS — K760 Fatty (change of) liver, not elsewhere classified: Secondary | ICD-10-CM | POA: Diagnosis not present

## 2021-12-13 DIAGNOSIS — K5904 Chronic idiopathic constipation: Secondary | ICD-10-CM | POA: Diagnosis not present

## 2021-12-13 DIAGNOSIS — K5731 Diverticulosis of large intestine without perforation or abscess with bleeding: Secondary | ICD-10-CM | POA: Diagnosis not present

## 2021-12-13 NOTE — Patient Instructions (Addendum)
If you are age 81 or older, your body mass index should be between 23-30. Your Body mass index is 33.47 kg/m?Marland Kitchen If this is out of the aforementioned range listed, please consider follow up with your Primary Care Provider. ? ?If you are age 9 or younger, your body mass index should be between 19-25. Your Body mass index is 33.47 kg/m?Marland Kitchen If this is out of the aformentioned range listed, please consider follow up with your Primary Care Provider.  ? ?________________________________________________________ ? ?The [place] [facility] providers would like to encourage you to use [facility] to communicate with providers for non-urgent requests or questions.  Due to [facility] hold times on the telephone, sending your provider a message by [facility] may be a faster and more efficient way to get a response.  Please allow 48 business hours for a response.  Please remember that this is for non-urgent requests.  ?_______________________________________________________ ? ?Please follow up with PCP about left hip/adductor. ? ?Please go to our basement x-ray department for abdominal x-ray. ? ?Will refer you to OB/GYN for evaluation. ?[address] [place], [place], [place] [address] ?Phone- [phone] ? ?Here some information about pelvic floor dysfunction. ?This may be contributing to some of your symptoms. ?We will continue with our evaluation but I do want you to consider adding on fiber supplement with low-dose MiraLAX daily. ?We could also refer to pelvic floor physical therapy. ? ? ?Pelvic Floor Dysfunction, Female ?Pelvic floor dysfunction (PFD) is a condition that results when the group of muscles and connective tissues that support the organs in the pelvis (pelvic floor muscles) do not work well. These muscles and their connections form a sling that supports the colon and bladder. In women, they also support the uterus. ?PFD causes pelvic floor muscles to be too weak, too tight, or both. In PFD, muscle movements are not coordinated. This  may cause bowel or bladder problems. It may also cause pain. ?What are the causes? ?This condition may be caused by an injury to the pelvic area or by a weakening of pelvic muscles. This often results from pregnancy and childbirth or other types of strain. In many cases, the exact cause is not known. ?What increases the risk? ?The following factors may make you more likely to develop this condition: ?Having chronic bladder tissue inflammation (interstitial cystitis). ?Being an older person. ?Being overweight. ?History of radiation treatment for cancer in the pelvic region. ?Previous pelvic surgery, such as removal of the uterus (hysterectomy). ?What are the signs or symptoms? ?Symptoms of this condition vary and may include: ?Bladder symptoms, such as: ?Trouble starting urination and emptying the bladder. ?Frequent urinary tract infections. ?Leaking urine when coughing, laughing, or exercising (stress incontinence). ?Having to pass urine urgently or frequently. ?Pain when passing urine. ?Bowel symptoms, such as: ?Constipation. ?Urgent or frequent bowel movements. ?Incomplete bowel movements. ?Painful bowel movements. ?Leaking stool or gas. ?Unexplained genital or rectal pain. ?Genital or rectal muscle spasms. ?Low back pain. ?Other symptoms may include: ?A heavy, full, or aching feeling in the vagina. ?A bulge that protrudes into the vagina. ?Pain during or after sex. ?How is this diagnosed? ?This condition may be diagnosed based on: ?Your symptoms and medical history. ?A physical exam. During the exam, your health care provider may check your pelvic muscles for tightness, spasm, pain, or weakness. This may include a rectal exam and a pelvic exam. ?In some cases, you may have diagnostic tests, such as: ?Electrical muscle function tests. ?Urine flow testing. ?X-ray tests of bowel function. ?Ultrasound  of the pelvic organs. ?How is this treated? ?Treatment for this condition depends on the symptoms. Treatment options  include: ?Physical therapy. This may include Kegel exercises to help relax or strengthen the pelvic floor muscles. ?Biofeedback. This type of therapy provides feedback on how tight your pelvic floor muscles are so that you can learn to control them. ?Internal or external massage therapy. ?A treatment that involves electrical stimulation of the pelvic floor muscles to help control pain (transcutaneous electrical nerve stimulation, or TENS). ?Sound wave therapy (ultrasound) to reduce muscle spasms. ?Medicines, such as: ?Muscle relaxants. ?Bladder control medicines. ?Surgery to reconstruct or support pelvic floor muscles may be an option if other treatments do not help. ?Follow these instructions at home: ?Activity ?Do your usual activities as told by your health care provider. Ask your health care provider if you should modify any activities. ?Do pelvic floor strengthening or relaxing exercises at home as told by your physical therapist. ?Lifestyle ?Maintain a healthy weight. ?Eat foods that are high in fiber, such as beans, whole grains, and fresh fruits and vegetables. ?Limit foods that are high in fat and processed sugars, such as fried or sweet foods. ?Manage stress with relaxation techniques such as yoga or meditation. ?General instructions ?If you have problems with leakage: ?Use absorbable pads or wear padded underwear. ?Wash frequently with mild soap. ?Keep your genital and anal area as clean and dry as possible. ?Ask your health care provider if you should try a barrier cream to prevent skin irritation. ?Take warm baths to relieve pelvic muscle tension or spasms. ?Take over-the-counter and prescription medicines only as told by your health care provider. ?Keep all follow-up visits. ?How is this prevented? ?The cause of PFD is not always known, but there are a few things you can do to reduce the risk of developing this condition, including: ?Staying at a healthy weight. ?Getting regular exercise. ?Managing  stress. ?Contact a health care provider if: ?Your symptoms are not improving with home care. ?You have signs or symptoms of PFD that get worse at home. ?You develop new signs or symptoms. ?You have signs of a urinary tract infection, such as: ?Fever. ?Chills. ?Increased urinary frequency. ?A burning feeling when urinating. ?You have not had a bowel movement in 3 days (constipation). ?Summary ?Pelvic floor dysfunction results when the muscles and connective tissues in your pelvic floor do not work well. ?These muscles and their connections form a sling that supports your colon and bladder. In women, they also support the uterus. ?PFD may be caused by an injury to the pelvic area or by a weakening of pelvic muscles. ?PFD causes pelvic floor muscles to be too weak, too tight, or a combination of both. Symptoms may vary from person to person. ?In most cases, PFD can be treated with physical therapies and medicines. Surgery may be an option if other treatments do not help. ?This information is not intended to replace advice given to you by your health care provider. Make sure you discuss any questions you have with your health care provider. ?Document Revised: 11/30/2020 Document Reviewed: 11/30/2020 ?Elsevier Patient Education ? [employer]. ? ?Adductor Muscle Strain ? ?An adductor muscle strain, also called a groin strain or pull, is an injury to the muscles or tendons on the upper, inner part of the thigh. These muscles are called the adductor or groin muscles. They are responsible for moving the legs across the body or pulling the legs together. ?A muscle strain occurs when a muscle is overstretched  and some muscle fibers are torn. The severity of an adductor muscle strain is rated as Grade 1, 2, or 3. A Grade 3 strain has the most tearing and pain. ?What are the causes? ?Adductor muscle strains usually occur during exercise or while participating in sports. ?This condition may be caused by: ?A sudden, violent  force placed on the muscle, stretching it too far. ?Stretching the muscles too far or too suddenly, often during side-to-side motion with a sudden change in direction. ?Putting repeated stress on the ad

## 2021-12-14 NOTE — Progress Notes (Signed)
Agree with assessment and plan as outlined.  ?Of note, given her age and no pathology on her last exam, she has aged out of routine colonoscopy screening. Reason to do any further exams would be based on symptoms moving forward.  ?

## 2021-12-15 ENCOUNTER — Telehealth: Payer: Self-pay | Admitting: Physician Assistant

## 2021-12-15 NOTE — Telephone Encounter (Signed)
See lab result note.

## 2021-12-15 NOTE — Telephone Encounter (Signed)
Patient returned your phone call.  If possible, please call her back today.  Thank you. ?

## 2022-02-01 DIAGNOSIS — I10 Essential (primary) hypertension: Secondary | ICD-10-CM | POA: Insufficient documentation

## 2022-02-01 NOTE — Progress Notes (Signed)
Cardiology Office Note   Date:  02/02/2022   ID:  Sara, Wells [birth year], MRN [id number]  PCP:  Haywood Pao, MD  Cardiologist:   Minus Breeding, MD   Chief Complaint  Patient presents with   Edema      History of Present Illness: Sara Wells is a 81 y.o. female who presents for evaluation of chronic lower extremity swelling.  This is my first visit with her.  She is actually seen Sara Sims DNP frequently.  She has had problems with chronic shortness of breath and lower extremity swelling.  However, any management with diuretics leads to dizziness and hypotension.  However, she continues to have chronic shortness of breath.  She gets this kind of episodically.  She has increased lower extremity swelling also episodically.  She has some good days and bad days.  Today she is having increased edema.  She did not put her compression stockings on and she had not yet started her diuretic.  She says she does not abuse salt.  She does watch her fluid intake although she drinks a lot of [employer] and mellow yellow.  She is not having any new PND or orthopnea.  She is not having any new chest pressure, neck or arm discomfort.  Studies have involved negative perfusion study in 2019.  Echocardiogram demonstrated well-preserved ejection fraction at that time with some questionable diastolic dysfunction mildly.  She is also had moderate obstructive disease on PFTs but had no response to bronchodilators.  She has not seen a pulmonologist.   Past Medical History:  Diagnosis Date   Arthritis    right hand   Breast cancer ([place]) 10/05/2002   right   Endometriosis    GERD (gastroesophageal reflux disease)     Past Surgical History:  Procedure Laterality Date   ABDOMINAL HYSTERECTOMY     age 50   BREAST SURGERY  2004&2009   RIGHT & LEFT   MOLE REMOVAL  number of years prior to 2004   early melanoma     Current Outpatient Medications  Medication Sig Dispense Refill    Albuterol Sulfate, sensor, (PROAIR DIGIHALER) 108 (90 Base) MCG/ACT AEPB 2 inhalations qid prn SOB Inhalation     Budeson-Glycopyrrol-Formoterol (BREZTRI AEROSPHERE) 160-9-4.8 MCG/ACT AERO Use 2 puffs twice daily Inhalation     D 1000 25 MCG (1000 UT) capsule SMARTSIG:1 By Mouth     furosemide (LASIX) 20 MG tablet Take 1 tablet (20 mg total) by mouth daily. (Patient taking differently: Take 20 mg by mouth as needed.) 90 tablet 3   hydrochlorothiazide (HYDRODIURIL) 12.5 MG tablet Take 12.5 mg by mouth daily.     losartan (COZAAR) 50 MG tablet Take 50 mg by mouth daily.     vitamin B-12 (CYANOCOBALAMIN) 1000 MCG tablet Take 1,000 mcg by mouth daily.     Wheat Dextrin (BENEFIBER) POWD Use as directed, daily  0   lisinopril (ZESTRIL) 5 MG tablet Take by mouth. (Patient not taking: Reported on 02/02/2022)     No current facility-administered medications for this visit.    Allergies:   Codeine   ROS:  Please see the history of present illness.   Otherwise, review of systems are positive for none.   All other systems are reviewed and negative.    PHYSICAL EXAM: VS:  BP 132/76   Pulse 63   Ht '5\' 4"'$  (1.626 m)   Wt 199 lb 12.8 oz (90.6 kg)   SpO2 93%  BMI 34.30 kg/m  , BMI Body mass index is 34.3 kg/m. GENERAL:  Well appearing NECK:  No jugular venous distention, waveform within normal limits, carotid upstroke brisk and symmetric, no bruits, no thyromegaly LUNGS:  Clear to auscultation bilaterally CHEST:  Unremarkable HEART:  PMI not displaced or sustained,S1 and S2 within normal limits, no S3, no S4, no clicks, no rubs, no murmurs ABD:  Flat, positive bowel sounds normal in frequency in pitch, no bruits, no rebound, no guarding, no midline pulsatile mass, no hepatomegaly, no splenomegaly EXT:  2 plus pulses throughout, moderate leg edema, no cyanosis no clubbing    EKG:  EKG is not ordered today. NA   Recent Labs: 06/07/2021: BUN 12; Creatinine, Ser 0.97; Potassium 3.7; Sodium  141 09/15/2021: Hemoglobin 14.8; Platelets 194.0    Lipid Panel    Component Value Date/Time   CHOL 176 02/21/2018 0844   TRIG 90 02/21/2018 0844   HDL 60 02/21/2018 0844   CHOLHDL 2.9 02/21/2018 0844   CHOLHDL 3.7 02/25/2013 1001   VLDL 29 02/25/2013 1001   LDLCALC 98 02/21/2018 0844      Wt Readings from Last 3 Encounters:  02/02/22 199 lb 12.8 oz (90.6 kg)  12/13/21 195 lb (88.5 kg)  09/15/21 196 lb 4.8 oz (89 kg)      Other studies Reviewed: Additional studies/ records that were reviewed today include: Previous studies and labs. Review of the above records demonstrates:  Please see elsewhere in the note.     ASSESSMENT AND PLAN:   Hypertension:   Her blood pressure is actually controlled.  She does not do well with med titration.  No change in therapy.    Dyspnea: I suspect this is multifactorial.  There probably is some component of diastolic dysfunction.  I am going to check a BNP level and I will have a low threshold for an echocardiogram.  Since she really does not tolerate a lot of diuretic the biggest issue will be watching salt which she says she does not abuse and reducing her fluid intake.  We had a [place] discussion about this.  I did ask her to talk with her primary provider about whether she might benefit from pulmonary consultation.  I will defer to Tisovec, Fransico Him, MD  Edema: As above.  She will take the Lasix as appropriate days at home.  She will keep her feet elevated and put her compression stockings on.  Current medicines are reviewed at length with the patient today.  The patient does not have concerns regarding medicines.  The following changes have been made:  no change  Labs/ tests ordered today include:   Orders Placed This Encounter  Procedures   Pro b natriuretic peptide (BNP)     Disposition:   FU with Sara Sims DNP in a couple of months.     Signed, Minus Breeding, MD  02/02/2022 12:59 PM    [facility]

## 2022-02-02 ENCOUNTER — Encounter: Payer: Self-pay | Admitting: Cardiology

## 2022-02-02 ENCOUNTER — Ambulatory Visit: Payer: Medicare Other | Admitting: Cardiology

## 2022-02-02 VITALS — BP 132/76 | HR 63 | Ht 64.0 in | Wt 199.8 lb

## 2022-02-02 DIAGNOSIS — R0602 Shortness of breath: Secondary | ICD-10-CM

## 2022-02-02 DIAGNOSIS — I10 Essential (primary) hypertension: Secondary | ICD-10-CM

## 2022-02-02 NOTE — Patient Instructions (Signed)
Medication Instructions:  Your physician recommends that you continue on your current medications as directed. Please refer to the Current Medication list given to you today.  *If you need a refill on your cardiac medications before your next appointment, please call your pharmacy*   Lab Work: Your physician recommends that you return for lab work in:  TODAY: BNP If you have labs (blood work) drawn today and your tests are completely normal, you will receive your results only by: [place] (if you have [place]) OR A paper copy in the mail If you have any lab test that is abnormal or we need to change your treatment, we will call you to review the results.   Testing/Procedures: None   Follow-Up: At [facility], you and your health needs are our priority.  As part of our continuing mission to provide you with exceptional heart care, we have created designated Provider Care Teams.  These Care Teams include your primary Cardiologist (physician) and Advanced Practice Providers (APPs -  Physician Assistants and Nurse Practitioners) who all work together to provide you with the care you need, when you need it.  We recommend signing up for the patient portal called "MyChart".  Sign up information is provided on this After Visit Summary.  MyChart is used to connect with patients for Virtual Visits (Telemedicine).  Patients are able to view lab/test results, encounter notes, upcoming appointments, etc.  Non-urgent messages can be sent to your provider as well.   To learn more about what you can do with MyChart, go to [email or web address].    Your next appointment:   2 month(s)  The format for your next appointment:   In Person  Provider:   Jory Sims, DNP, ANP        Other Instructions   Important Information About Sugar

## 2022-02-03 LAB — PRO B NATRIURETIC PEPTIDE: NT-Pro BNP: 398 pg/mL (ref 0–738)

## 2022-02-05 ENCOUNTER — Ambulatory Visit (HOSPITAL_BASED_OUTPATIENT_CLINIC_OR_DEPARTMENT_OTHER): Payer: Medicare Other | Admitting: Obstetrics & Gynecology

## 2022-02-05 ENCOUNTER — Encounter (HOSPITAL_BASED_OUTPATIENT_CLINIC_OR_DEPARTMENT_OTHER): Payer: Self-pay | Admitting: Obstetrics & Gynecology

## 2022-02-05 ENCOUNTER — Encounter: Payer: Self-pay | Admitting: *Deleted

## 2022-02-05 VITALS — BP 135/61 | HR 63 | Ht 62.0 in | Wt 196.0 lb

## 2022-02-05 DIAGNOSIS — Z90710 Acquired absence of both cervix and uterus: Secondary | ICD-10-CM

## 2022-02-05 DIAGNOSIS — R1032 Left lower quadrant pain: Secondary | ICD-10-CM | POA: Diagnosis not present

## 2022-02-05 NOTE — Progress Notes (Signed)
GYNECOLOGY  VISIT  CC:   abdominal pain/pelvic pain  HPI: 81 y.o. G0 Single White or Caucasian female here for new patient problem visit.  Pt reports having pelvic/abdominal pain that started last summer.  She has hx of diverticulosis but never had diverticulitis.  Has seen her Dr. Osborne Casco and was advised she may need to also see a urologist.  She denies any urinary issues.  No appt has been made per her.  Pt has region in lower pelvis that seems to be the focus of most of her pain.  Does not have pain with movement.  She denies vaginal bleeding or vaginal discharge.  Does not remember having any trauma  No new urinary issues.  No recent fever.  Pt has CT abd/pelvic done 03/2021.  Reviewed.  Significant diverticulosis noted.  X ray abd done 12/2021.  Reviewed.  Degenerative joint changes of spine noted.    H/O abdominal hysterectomy.  Pt unsure if ovaries remain.   Past Medical History:  Diagnosis Date   Arthritis    right hand   Breast cancer ([place]) 10/05/2002   right   Endometriosis    GERD (gastroesophageal reflux disease)     MEDS:   Current Outpatient Medications on File Prior to Visit  Medication Sig Dispense Refill   Albuterol Sulfate, sensor, (PROAIR DIGIHALER) 108 (90 Base) MCG/ACT AEPB 2 inhalations qid prn SOB Inhalation     Budeson-Glycopyrrol-Formoterol (BREZTRI AEROSPHERE) 160-9-4.8 MCG/ACT AERO Use 2 puffs twice daily Inhalation     D 1000 25 MCG (1000 UT) capsule SMARTSIG:1 By Mouth     hydrochlorothiazide (HYDRODIURIL) 12.5 MG tablet Take 12.5 mg by mouth daily.     lisinopril (ZESTRIL) 5 MG tablet Take by mouth.     losartan (COZAAR) 50 MG tablet Take 50 mg by mouth daily.     vitamin B-12 (CYANOCOBALAMIN) 1000 MCG tablet Take 1,000 mcg by mouth daily.     Wheat Dextrin (BENEFIBER) POWD Use as directed, daily  0   furosemide (LASIX) 20 MG tablet Take 1 tablet (20 mg total) by mouth daily. (Patient taking differently: Take 20 mg by mouth as needed.) 90 tablet 3   No  current facility-administered medications on file prior to visit.    ALLERGIES: Codeine  SH:  single, non smoker  Review of Systems  Constitutional: Negative.   Genitourinary: Negative.     PHYSICAL EXAMINATION:    BP 135/61 (BP Location: Left Arm, Patient Position: Sitting, Cuff Size: Large)   Pulse 63   Ht '5\' 2"'$  (1.575 m)   Wt 196 lb (88.9 kg)   BMI 35.85 kg/m     General appearance: alert, cooperative and appears stated age Abdomen: soft, non-tender; bowel sounds normal; no masses,  no organomegaly, pt has exquisite pain to palpation of area just lateral to where inguinal ligament would attach to pubic symphysis, Lymph:  no inguinal LAD noted  Pelvic: External genitalia:  no lesions              Urethra:  normal appearing urethra with no masses, tenderness or lesions              Bartholins and Skenes: normal                 Vagina: normal appearing vagina with normal color and discharge, no lesions              Cervix: absent              Bimanual  Exam:  Uterus:  uterus absent              Adnexa: no mass, fullness, tenderness               Chaperone, Octaviano Batty, CMA, was present for exam.  Assessment/Plan: 1. Left inguinal pain - feel pt need MRI of pelvis.  Will order and see if need to do any additional imaging first.    2. History of hysterectomy - normal pelvic exam today

## 2022-02-11 DIAGNOSIS — H9311 Tinnitus, right ear: Secondary | ICD-10-CM | POA: Insufficient documentation

## 2022-02-12 ENCOUNTER — Telehealth (HOSPITAL_BASED_OUTPATIENT_CLINIC_OR_DEPARTMENT_OTHER): Payer: Self-pay | Admitting: *Deleted

## 2022-02-12 NOTE — Telephone Encounter (Signed)
LMOVM for pt to return my call.  

## 2022-02-12 NOTE — Telephone Encounter (Signed)
-----   Message from Megan Salon, MD sent at 02/11/2022  4:02 PM EDT ----- Regarding: MRI of pelvis Kim, I saw this pt early last week.  She was referred (she is 44) for pelvic pain.  She has this very tender area at her inguinal ligament.  She had CT scan about 9 month ago.  I discussed with her doing a pelvic MRI.  I've been advised she needs a pelvic x ray first prior to getting the MRI ordered.  I can order this to be done at [facility] or another location.  Can you see where she wants to go for the pelvic x ray.  Once that's done then we will proceed with the MRI.  I haven't placed the x ray order yet.  Thanks.  Vinnie Level

## 2022-02-14 ENCOUNTER — Ambulatory Visit (HOSPITAL_BASED_OUTPATIENT_CLINIC_OR_DEPARTMENT_OTHER): Payer: Medicare Other | Admitting: Obstetrics & Gynecology

## 2022-03-07 ENCOUNTER — Other Ambulatory Visit (HOSPITAL_BASED_OUTPATIENT_CLINIC_OR_DEPARTMENT_OTHER): Payer: Self-pay | Admitting: Obstetrics & Gynecology

## 2022-03-07 DIAGNOSIS — R1032 Left lower quadrant pain: Secondary | ICD-10-CM

## 2022-03-14 ENCOUNTER — Telehealth (HOSPITAL_BASED_OUTPATIENT_CLINIC_OR_DEPARTMENT_OTHER): Payer: Self-pay | Admitting: *Deleted

## 2022-03-14 NOTE — Telephone Encounter (Signed)
Patient call and would like for nurse to call her about the order Dr.Miller did back on 02/05/2022.

## 2022-03-14 NOTE — Progress Notes (Signed)
Cardiology Clinic Note   Patient Name: Sara Wells Date of Encounter: 03/16/2022  Primary Care Provider:  Haywood Pao, MD Primary Cardiologist:  Sara Breeding, MD  Patient Profile    81 year old  female who presents for evaluation of chronic lower extremity swelling.  She has a history of chronic dyspnea, hypertension, chronic LEE. She was last seen by Sara Wells for dyspnea and LEE. She was to continue low sodium diet and diuretic. Echocardiogram was was recommended due to dyspnea, with possible pulmonary referral.  BNP was ordered and found to be normal.  Past Medical History    Past Medical History:  Diagnosis Date   Arthritis    right hand   Breast cancer ([place]) 10/05/2002   right   Endometriosis    GERD (gastroesophageal reflux disease)    Hypertension    SOB (shortness of breath)    Past Surgical History:  Procedure Laterality Date   ABDOMINAL HYSTERECTOMY     age 37, done due to endometriosis, ovaries remain   BREAST LUMPECTOMY Right 2004   breast cancer   BREAST LUMPECTOMY Left 2009   Lipoma, Sara Wells   MOLE REMOVAL  number of years prior to 2004   early melanoma    Allergies  Allergies  Allergen Reactions   Codeine Rash    History of Present Illness    Sara Wells comes today feeling "about the same", continues to have some mild dyspnea on exertion.  She is decided that she does not want to have a follow-up echo or a referral to pulmonology at this time as she is able to tolerate her current symptoms.  She is taking care of his significant other now who has had fractured foot and leg which is created a lot of stress for her.  She wishes to continue her current regimen at this time.  She does admit to being under a lot of stress and having periods of depression.  She is medically compliant.  She states that shortness of breath only occurs when she is exerting herself heavily.  Her lower extremity edema is well controlled with compression hose.  Home  Medications    Current Outpatient Medications  Medication Sig Dispense Refill   Albuterol Sulfate, sensor, (PROAIR DIGIHALER) 108 (90 Base) MCG/ACT AEPB 2 inhalations qid prn SOB Inhalation     Budeson-Glycopyrrol-Formoterol (BREZTRI AEROSPHERE) 160-9-4.8 MCG/ACT AERO Use 2 puffs twice daily Inhalation     D 1000 25 MCG (1000 UT) capsule SMARTSIG:1 By Mouth     furosemide (LASIX) 20 MG tablet Take 1 tablet (20 mg total) by mouth daily. (Patient taking differently: Take 20 mg by mouth as needed.) 90 tablet 3   hydrochlorothiazide (HYDRODIURIL) 12.5 MG tablet Take 12.5 mg by mouth daily.     losartan (COZAAR) 50 MG tablet Take 50 mg by mouth daily.     sertraline (ZOLOFT) 25 MG tablet Take 1 tablet (25 mg total) by mouth daily. 90 tablet 1   vitamin B-12 (CYANOCOBALAMIN) 1000 MCG tablet Take 1,000 mcg by mouth daily.     Wheat Dextrin (BENEFIBER) POWD Use as directed, daily  0   No current facility-administered medications for this visit.     Family History    Family History  Problem Relation Age of Onset   Heart attack Mother    Heart attack Father    Heart disease Brother    Heart disease Brother    Colon cancer Neg Hx    Stomach cancer Neg  Hx    Esophageal cancer Neg Hx    Pancreatic cancer Neg Hx    She indicated that her mother is deceased. She indicated that her father is deceased. She indicated that two of her four brothers are alive. She indicated that the status of her neg hx is unknown.  Social History    Social History   Socioeconomic History   Marital status: Single    Spouse name: Not on file   Number of children: Not on file   Years of education: Not on file   Highest education level: Not on file  Occupational History   Occupation: retired  Tobacco Use   Smoking status: Never   Smokeless tobacco: Never  Vaping Use   Vaping Use: Never used  Substance and Sexual Activity   Alcohol use: No   Drug use: No   Sexual activity: Not Currently  Other Topics  Concern   Not on file  Social History Narrative   Not on file   Social Determinants of Health   Financial Resource Strain: Not on file  Food Insecurity: Not on file  Transportation Needs: Not on file  Physical Activity: Not on file  Stress: Not on file  Social Connections: Not on file  Intimate Partner Violence: Not on file     Review of Systems    General:  No chills, fever, night sweats or weight changes.  Cardiovascular:  No chest pain, positive for dyspnea on exertion, mild lower extremity edema, orthopnea, palpitations, paroxysmal nocturnal dyspnea. Dermatological: No rash, lesions/masses Respiratory: No cough, dyspnea Urologic: No hematuria, dysuria Abdominal:   No nausea, vomiting, diarrhea, bright red blood per rectum, melena, or hematemesis Neurologic:  No visual changes, wkns, changes in mental status. All other systems reviewed and are otherwise negative except as noted above.     Physical Exam    VS:  BP (!) 116/58   Pulse 64   Ht 5' 1.5" (1.562 m)   Wt 194 lb (88 kg)   SpO2 98%   BMI 36.06 kg/m  , BMI Body mass index is 36.06 kg/m.     GEN: Well nourished, well developed, in no acute distress. HEENT: normal. Neck: Supple, no JVD, carotid bruits, or masses. Cardiac: RRR, no murmurs, rubs, or gallops. No clubbing, cyanosis, mild dependent edema.  Radials/DP/PT 2+ and equal bilaterally.  Respiratory:  Respirations regular and unlabored, clear to auscultation bilaterally. GI: Soft, nontender, nondistended, BS + x 4.  Central obesity MS: no deformity or atrophy. Skin: warm and dry, no rash. Neuro:  Strength and sensation are intact. Psych: Normal affect.  Accessory Clinical Findings    ECG personally reviewed by me today-not completed this office visit  Lab Results  Component Value Date   WBC 7.0 09/15/2021   HGB 14.8 09/15/2021   HCT 44.7 09/15/2021   MCV 89.6 09/15/2021   PLT 194.0 09/15/2021   Lab Results  Component Value Date   CREATININE  0.97 06/07/2021   BUN 12 06/07/2021   NA 141 06/07/2021   K 3.7 06/07/2021   CL 101 06/07/2021   CO2 26 06/07/2021   Lab Results  Component Value Date   ALT 11 02/21/2018   AST 14 02/21/2018   ALKPHOS 53 02/21/2018   BILITOT 0.6 02/21/2018   Lab Results  Component Value Date   CHOL 176 02/21/2018   HDL 60 02/21/2018   LDLCALC 98 02/21/2018   TRIG 90 02/21/2018   CHOLHDL 2.9 02/21/2018    No results found  for: "HGBA1C"  Review of Prior Studies:  Echocardiogram: 01/08/2018 Left ventricle: The cavity size was normal. Systolic function was    normal. The estimated ejection fraction was 55%. Wall motion was    normal; there were no regional wall motion abnormalities. Doppler    parameters are consistent with both elevated ventricular    end-diastolic filling pressure and elevated left atrial filling    pressure.  - Mitral valve: There was mild regurgitation. Valve area by    pressure half-time: 2.06 cm^2.  - Left atrium: The atrium was mildly dilated.  - Atrial septum: No defect or patent foramen ovale was identified.  - Impressions: GLS -17   Venous Doppler of LE 04/25/2020 Summary:  RIGHT:  - There is no evidence of deep vein thrombosis in the lower extremity.  - There is no evidence of superficial venous thrombosis.     - A cystic structure is found in the popliteal fossa measuring  approximately 2.60 cm x 3.71 cm.     LEFT:  - No evidence of common femoral vein obstruction. Assessment & Plan   1.  Hypertension: Currently well controlled on HCTZ, losartan, and Lasix.  Would not make any changes at this time.  If blood pressure continues to be an issue can change HCTZ to chlorthalidone as this has been found to have better blood pressure control results.  She does not wish to pursue an echocardiogram at this time.  2.  Chronic dyspnea on exertion: She uses inhalers which have been helpful.  She does not wish to have a pulmonary referral until symptoms worsen.  She will  continue to see her primary care provider to discuss this further.  They may decide together that a pulmonary consultation will be favorable to her.  At this time she would like to defer this referral.  3.  Situational stress: Currently caring for significant other who has mild dementia, and fractured leg and foot.  This is causing a lot of depression and stress.  She is requesting some help with this.  I will start her on low-dose Lexapro 25 mg daily.  I have advised her to follow-up with her primary care provider to advance the dose as they see fit.  Cardiology will not be managing her anxiety and depressive symptoms related to her home situation at this time.  However she is encouraged to talk to her primary care provider for management and titration of SSRI.  She verbalizes understanding and is willing to try the low-dose Lexapro with titration by primary care.  Current medicines are reviewed at length with the patient today.  I have spent 25 min's  dedicated to the care of this patient on the date of this encounter to include pre-visit review of records, assessment, management and diagnostic testing,with shared decision making.  Signed, Phill Myron. West Pugh, ANP, [place]   03/16/2022 4:39 PM      Office [phone] Fax [phone]  Notice: This dictation was prepared with Dragon dictation along with smaller phrase technology. Any transcriptional errors that result from this process are unintentional and may not be corrected upon review.

## 2022-03-15 NOTE — Telephone Encounter (Signed)
Pt called to make [place] aware that no one has reached out to her for scheduling of imaging that Dr. Sabra Heck had recommended. Pt states that the pain is improved and would like to know if Dr. Sabra Heck wants to proceed with testing. Advised that she is out of the office but I will discuss with her when she returns. Pt ok with plan

## 2022-03-16 ENCOUNTER — Encounter: Payer: Self-pay | Admitting: Adult Health

## 2022-03-16 ENCOUNTER — Ambulatory Visit: Payer: Medicare Other | Admitting: Adult Health

## 2022-03-16 VITALS — BP 116/58 | HR 64 | Ht 61.5 in | Wt 194.0 lb

## 2022-03-16 DIAGNOSIS — R0689 Other abnormalities of breathing: Secondary | ICD-10-CM

## 2022-03-16 DIAGNOSIS — F439 Reaction to severe stress, unspecified: Secondary | ICD-10-CM

## 2022-03-16 DIAGNOSIS — R0600 Dyspnea, unspecified: Secondary | ICD-10-CM | POA: Diagnosis not present

## 2022-03-16 DIAGNOSIS — I10 Essential (primary) hypertension: Secondary | ICD-10-CM | POA: Diagnosis not present

## 2022-03-16 MED ORDER — SERTRALINE HCL 25 MG PO TABS: 25.0000 mg | ORAL_TABLET | Freq: Every day | ORAL | 1 refills | Status: AC

## 2022-03-16 NOTE — Patient Instructions (Signed)
Medication Instructions:  Start Zoloft 25 mg ( take 1 Tablet Daily). *If you need a refill on your cardiac medications before your next appointment, please call your pharmacy*   Lab Work: No Labs If you have labs (blood work) drawn today and your tests are completely normal, you will receive your results only by: [place] (if you have MyChart) OR A paper copy in the mail If you have any lab test that is abnormal or we need to change your treatment, we will call you to review the results.   Testing/Procedures: No Testing   Follow-Up: At [facility], you and your health needs are our priority.  As part of our continuing mission to provide you with exceptional heart care, we have created designated Provider Care Teams.  These Care Teams include your primary Cardiologist (physician) and Advanced Practice Providers (APPs -  Physician Assistants and Nurse Practitioners) who all work together to provide you with the care you need, when you need it.   Your next appointment:   6 month(s)  The format for your next appointment:   In Person  Provider:   Jory Sims, [place],

## 2022-03-27 NOTE — Telephone Encounter (Signed)
Called pt back to let her know that if she is no longer having pain, then can hold off on MRI. Pt will call back if pain returns.

## 2022-07-01 IMAGING — DX DG ABDOMEN 1V
2 series · 2 of 2 positions shown · non-contrast
Comparison: None Available.

CLINICAL DATA: Evaluate for stool burden. Diarrhea and constipation
for 1 year.

EXAM:
ABDOMEN - 1 VIEW

[abdomen kub (1 of 2)]
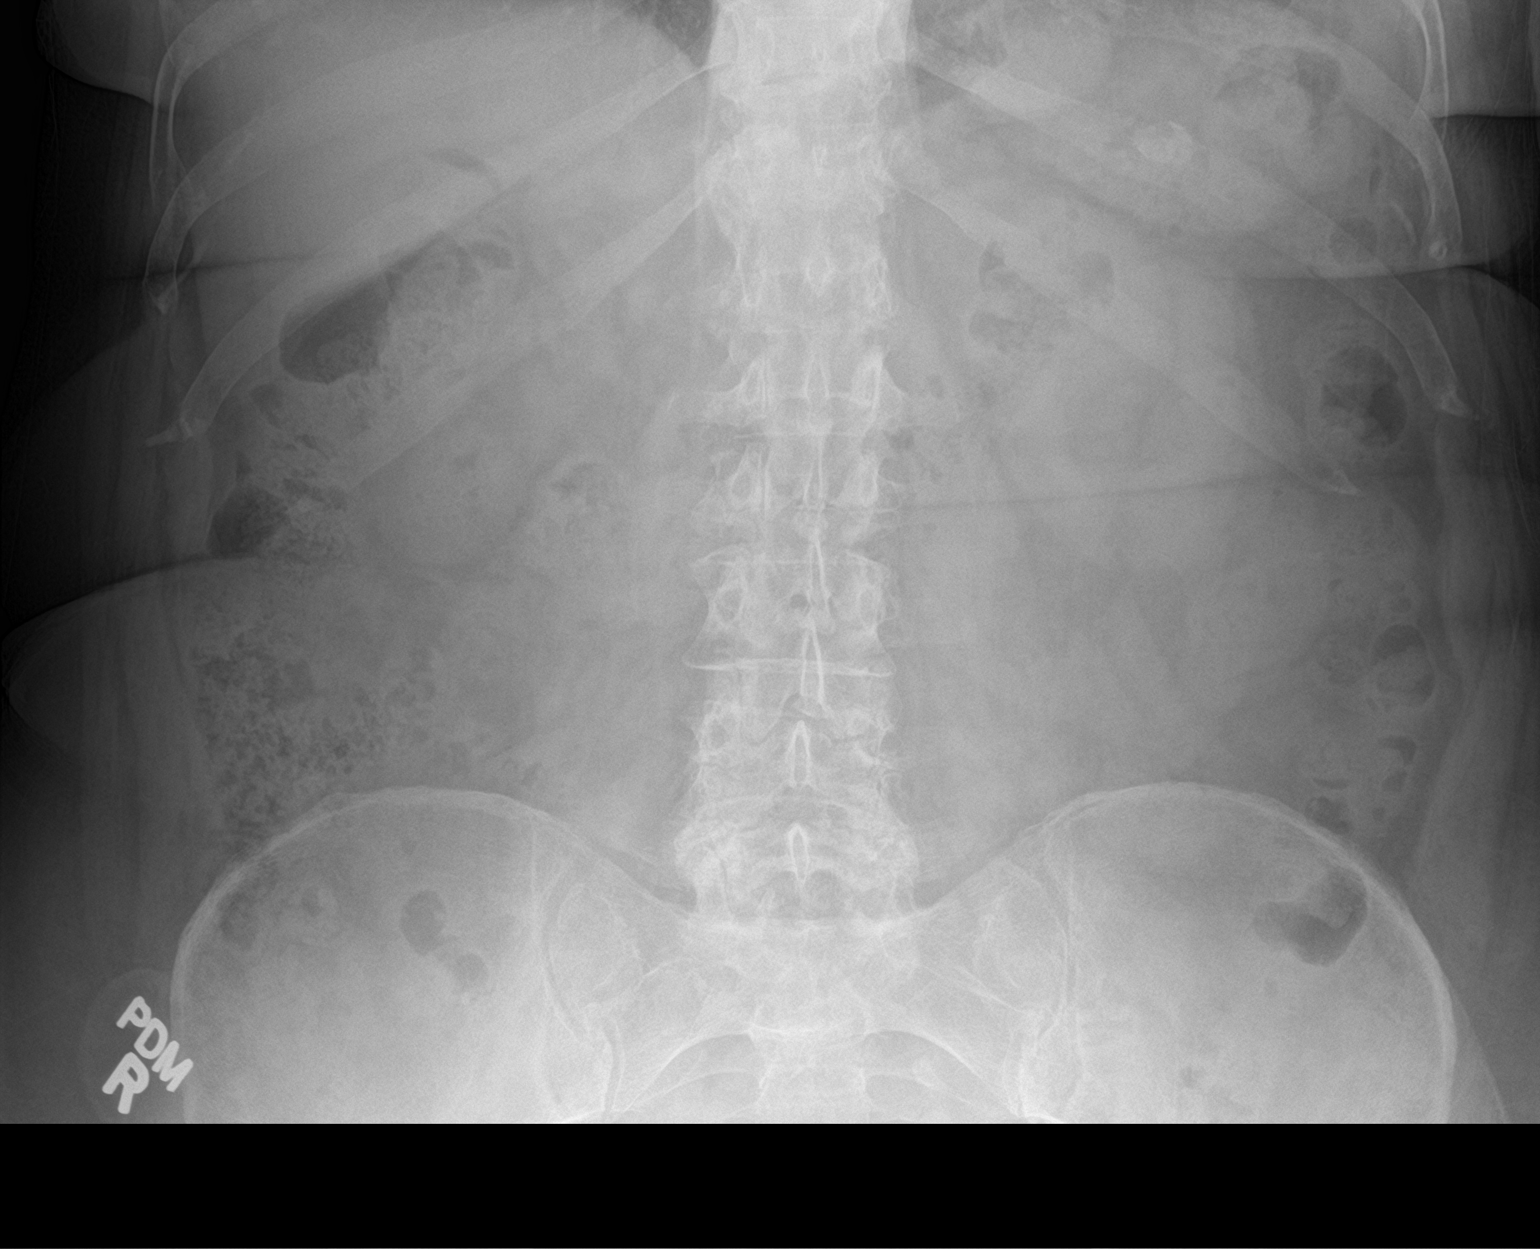

[abdomen kub (2 of 2)]
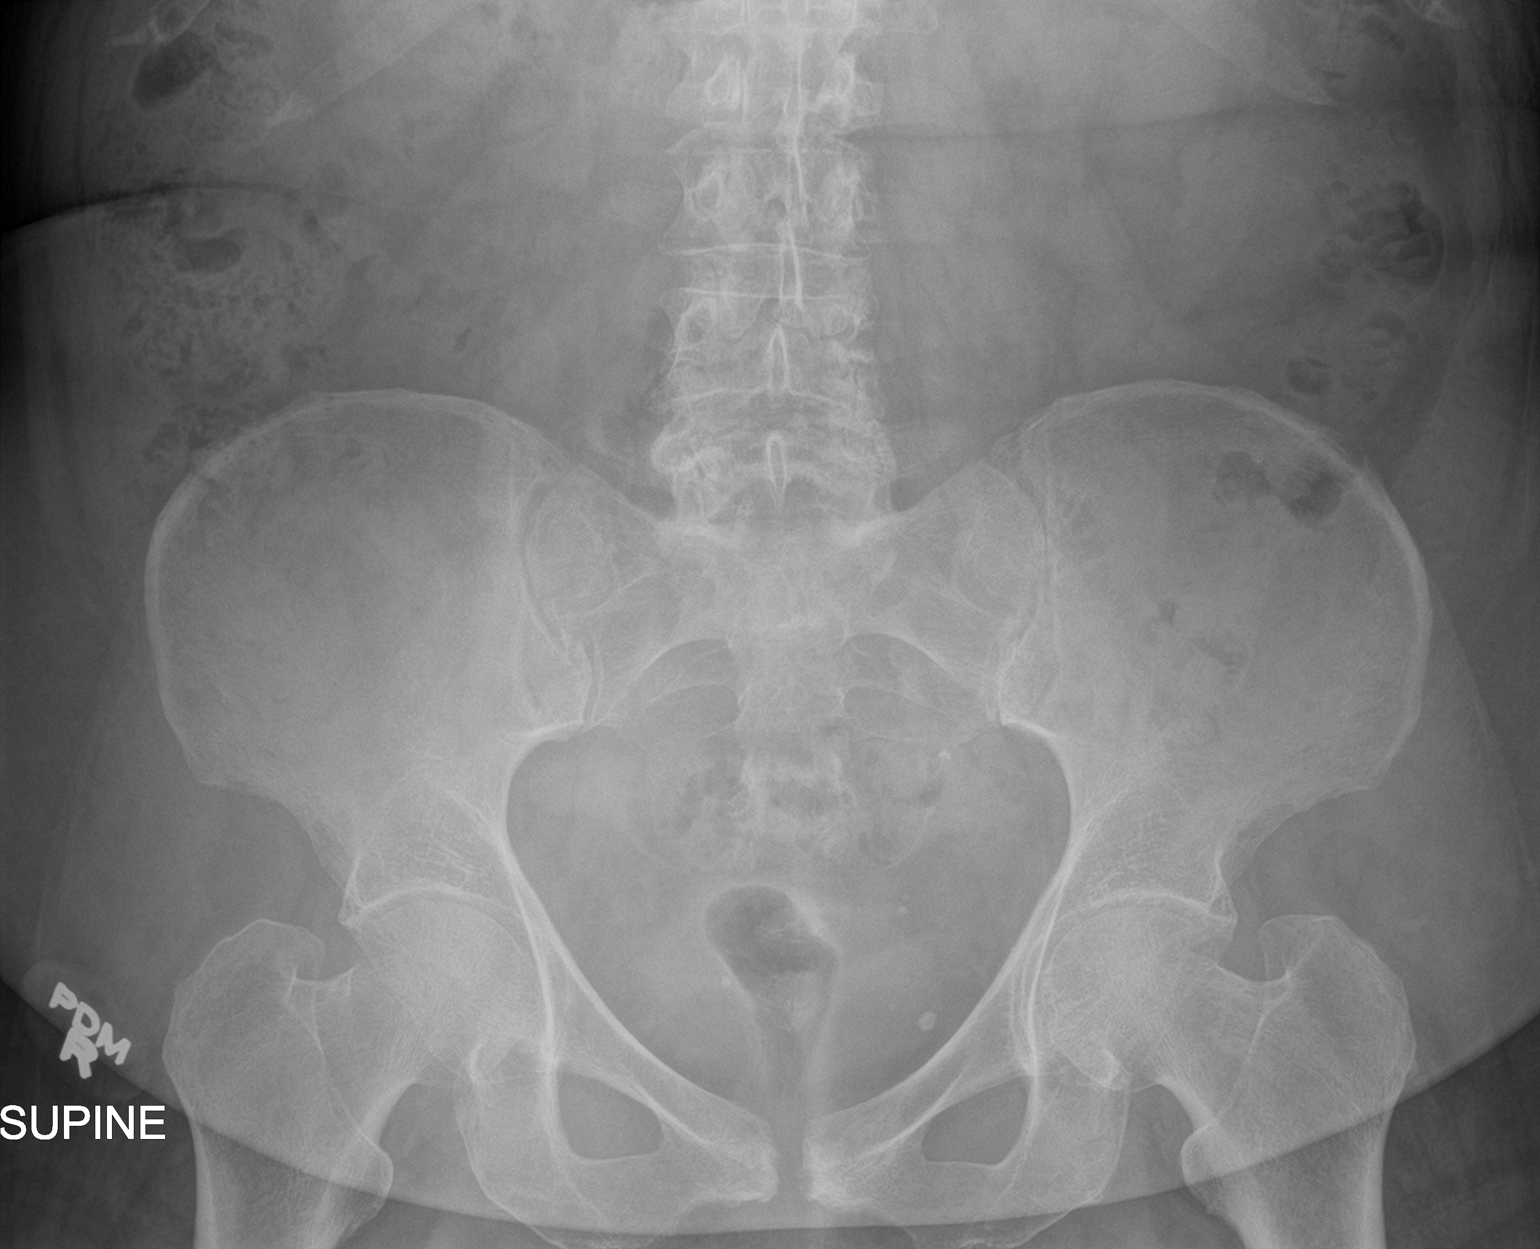

[2 of 2 positions shown; findings below may reference images not displayed]

FINDINGS: The bowel gas pattern is normal. No radio-opaque calculi or other
significant radiographic abnormality are seen. Extensive bowel
content is identified throughout colon. Degenerative joint changes
of spine are.
IMPRESSION: 1. No bowel obstruction.
2. Constipation.

## 2022-08-30 ENCOUNTER — Other Ambulatory Visit: Payer: Self-pay | Admitting: Adult Health

## 2022-09-18 ENCOUNTER — Ambulatory Visit: Payer: Medicare Other | Admitting: Cardiology

## 2022-11-08 NOTE — Progress Notes (Signed)
  Cardiology Office Note:   Date:  11/09/2022  ID:  Sara Wells, DOB 1941/02/04, MRN [id number]  History of Present Illness:   Sara Wells is a 82 y.o. female who presents for evaluation of chronic lower extremity swelling.  Prior to the last visit she had a work up with studies including negative perfusion study in 2019.  Echocardiogram demonstrated well-preserved ejection fraction at that time with some questionable diastolic dysfunction mildly.  She is also had moderate obstructive disease on PFTs but had no response to bronchodilators.  When I saw her she had chronic SOB but proBNP was normal.    She did have some mildly abnormal pulmonary function testing in 2019.  S She continues to have however some dyspnea with exertion.  She says she gets short of breath when she is doing things like vacuuming.  She denies any chest pressure, neck or arm discomfort.  She is not really reporting any palpitations, presyncope or syncope.  She said she was treated with bronchodilators but she did not have any improvement with this.  She lost about 10 pounds and she is not sure that this helped her breathing.  She has cut down on her [facility] and mellow yellow.    ROS: Positive for back pain, joint pain, balance issues. Otherwise as stated in the HPI and negative for all other systems.  Studies Reviewed:    EKG: Sinus bradycardia, rate 53, axis within normal limits, intervals within normal limits, poor anterior R wave progression  Risk Assessment/Calculations:      Physical Exam:   VS:  BP 130/70   Pulse (!) 53   Ht 4\' 9"  (1.448 m)   Wt 188 lb 3.2 oz (85.4 kg)   SpO2 94%   BMI 40.73 kg/m    Wt Readings from Last 3 Encounters:  11/09/22 188 lb 3.2 oz (85.4 kg)  03/16/22 194 lb (88 kg)  02/05/22 196 lb (88.9 kg)     GEN: Well nourished, well developed in no acute distress NECK: No JVD; No carotid bruits CARDIAC: RRR, no murmurs, rubs, gallops RESPIRATORY:  Clear to auscultation without rales,  wheezing or rhonchi  ABDOMEN: Soft, non-tender, non-distended EXTREMITIES: Mild bilateral leg edema; No deformity   ASSESSMENT AND PLAN:   Hypertension: The blood pressure is controlled.  No change in therapy.   Dyspnea: I think this is probably multifactorial.  My plan is to check an echocardiogram and if it is not significantly different than previous I would not suggest further cardiovascular testing as BNP was previously within normal limits.  She has had abnormal pulmonary function testing and I will refer her back to her primary provider to see if he would want to consider pulmonary referral.   Edema: She is on low-dose diuretic.  No change in therapy.        Signed, Rollene Rotunda, MD

## 2022-11-09 ENCOUNTER — Encounter: Payer: Self-pay | Admitting: Cardiology

## 2022-11-09 ENCOUNTER — Ambulatory Visit: Payer: Medicare Other | Attending: Cardiology | Admitting: Cardiology

## 2022-11-09 VITALS — BP 130/70 | HR 53 | Ht <= 58 in | Wt 188.2 lb

## 2022-11-09 DIAGNOSIS — G8929 Other chronic pain: Secondary | ICD-10-CM | POA: Diagnosis not present

## 2022-11-09 DIAGNOSIS — M5441 Lumbago with sciatica, right side: Secondary | ICD-10-CM

## 2022-11-09 DIAGNOSIS — R0602 Shortness of breath: Secondary | ICD-10-CM | POA: Diagnosis not present

## 2022-11-09 NOTE — Patient Instructions (Signed)
Medication Instructions:  Your physician recommends that you continue on your current medications as directed. Please refer to the Current Medication list given to you today.  *If you need a refill on your cardiac medications before your next appointment, please call your pharmacy*   Testing/Procedures: Your physician has requested that you have an echocardiogram. Echocardiography is a painless test that uses sound waves to create images of your heart. It provides your doctor with information about the size and shape of your heart and how well your heart's chambers and valves are working. This procedure takes approximately one hour. There are no restrictions for this procedure. Please do NOT wear cologne, perfume, aftershave, or lotions (deodorant is allowed). Please arrive 15 minutes prior to your appointment time. This procedure will be done at [address] [place]    Follow-Up: At [facility], you and your health needs are our priority.  As part of our continuing mission to provide you with exceptional heart care, we have created designated Provider Care Teams.  These Care Teams include your primary Cardiologist (physician) and Advanced Practice Providers (APPs -  Physician Assistants and Nurse Practitioners) who all work together to provide you with the care you need, when you need it.  We recommend signing up for the patient portal called "MyChart".  Sign up information is provided on this After Visit Summary.  MyChart is used to connect with patients for Virtual Visits (Telemedicine).  Patients are able to view lab/test results, encounter notes, upcoming appointments, etc.  Non-urgent messages can be sent to your provider as well.   To learn more about what you can do with MyChart, go to [email or web address].    Your next appointment:   12 month(s)  Provider:   Joni Reining, DNP, ANP

## 2022-12-11 ENCOUNTER — Ambulatory Visit (HOSPITAL_COMMUNITY): Payer: Medicare Other | Attending: Cardiology

## 2022-12-11 DIAGNOSIS — R0602 Shortness of breath: Secondary | ICD-10-CM | POA: Insufficient documentation

## 2022-12-13 LAB — ECHOCARDIOGRAM COMPLETE
Area-P 1/2: 3.58 cm2
MV M vel: 5.1 m/s
MV Peak grad: 103.8 mmHg
Radius: 0.5 cm
S' Lateral: 3.6 cm

## 2023-05-20 ENCOUNTER — Encounter: Payer: Self-pay | Admitting: Physician Assistant

## 2023-05-20 ENCOUNTER — Other Ambulatory Visit (INDEPENDENT_AMBULATORY_CARE_PROVIDER_SITE_OTHER): Payer: Medicare Other

## 2023-05-20 ENCOUNTER — Ambulatory Visit: Payer: Medicare Other | Admitting: Physician Assistant

## 2023-05-20 DIAGNOSIS — M5441 Lumbago with sciatica, right side: Secondary | ICD-10-CM

## 2023-05-20 DIAGNOSIS — M25551 Pain in right hip: Secondary | ICD-10-CM | POA: Diagnosis not present

## 2023-05-20 DIAGNOSIS — G8929 Other chronic pain: Secondary | ICD-10-CM | POA: Diagnosis not present

## 2023-05-20 NOTE — Addendum Note (Signed)
Addended by: Mardene Celeste B on: 05/20/2023 10:09 AM   Modules accepted: Orders

## 2023-05-20 NOTE — Progress Notes (Signed)
Office Visit Note   Patient: Sara Wells           Date of Birth: 06-23-1941           MRN: [id number] Visit Date: 05/20/2023              Requested by: Gaspar Garbe, MD [address] [place],  [place] [address] PCP: Wylene Simmer Adelfa Koh, MD   Assessment & Plan: Visit Diagnoses:  1. Chronic right-sided low back pain with right-sided sciatica   2. Pain in right hip     Plan:  Given the fact that her pain is remained virtually unchanged from what she has had in the past and that it fits her spinal stenosis recommend an ESI with Dr. Alvester Morin.  Offered her formal therapy she defers.  She states she has exercises at home that she can form.  Also offered a hip injection she states that the hip does not bother her enough for injection.  Regards to the cramping she will talk to her PCP cramps and we will see what her potassium level is.  Will see her back 4 weeks after the [facility] and see how she is doing overall.  Questions were encouraged and answered at length.  Follow-Up Instructions: Return 4 weeks after ESI.   Orders:  Orders Placed This Encounter  Procedures   XR Lumbar Spine 2-3 Views   XR HIP UNILAT W OR W/O PELVIS 2-3 VIEWS RIGHT   No orders of the defined types were placed in this encounter.     Procedures: No procedures performed   Clinical Data: No additional findings.   Subjective: Chief Complaint  Patient presents with   Right Hip - Pain    HPI Sara Wells returns today due to right hip pain and low back pain.  She states she is having pain but radiates from her low back down to her right hip.  Denies any groin pain.  Denies any numbness tingling.  She does note that she has bad cramps at times in her right hip and lower extremity region.  This does not occur daily.  She denies any waking pain.  She has had intentional weight loss of 6 pounds.  No bowel or bladder dysfunction or saddle anesthesia like symptoms.  Prior MRI 2018 lumbar spine showed severe central  canal stenosis at L4-5 and mild to moderate central canal stenosis at L3-4.  Review of Systems See HPI otherwise negative  Objective: Vital Signs: There were no vitals taken for this visit.  Physical Exam Constitutional:      Appearance: She is not ill-appearing or diaphoretic.  Cardiovascular:     Pulses: Normal pulses.  Pulmonary:     Effort: Pulmonary effort is normal.  Neurological:     Mental Status: She is alert and oriented to person, place, and time.  Psychiatric:        Mood and Affect: Mood normal.     Ortho Exam Bilateral hips: Good range of motion of both hips without pain.  Tenderness over the right hip trochanteric region only. Lumbar spine: Positive straight leg raise bilaterally.  Lower extremities 5 out of 5 strength throughout the lower extremities against resistance.  Tight hamstrings bilaterally.  Normal sensation to light touch bilateral feet throughout.  Specialty Comments:  No specialty comments available.  Imaging: XR Lumbar Spine 2-3 Views  Result Date: 05/20/2023 Lumbar spine 2 views: No acute fractures.  Normal lordotic curvature.  Lower lumbar facet arthritic changes.  Grade  1 spondylolisthesis L4-L5.  XR HIP UNILAT W OR W/O PELVIS 2-3 VIEWS RIGHT  Result Date: 05/20/2023 AP pelvis lateral view right hip: Bilateral hips well located.  No acute fractures acute findings.  Slight narrowing of bilateral hips otherwise hip joints well-preserved.     PMFS History: Patient Active Problem List   Diagnosis Date Noted   Tinnitus of right ear 02/11/2022   Essential hypertension 02/01/2022   Dyspnea and respiratory abnormalities 08/04/2021   Chronic right-sided low back pain with right-sided sciatica 02/25/2017   Spondylolisthesis, lumbar region 02/25/2017   Breast cancer (HCC) 10/05/2002   Past Medical History:  Diagnosis Date   Arthritis    right hand   Breast cancer (HCC) 10/05/2002   right   Endometriosis    GERD (gastroesophageal reflux  disease)    Hypertension    SOB (shortness of breath)     Family History  Problem Relation Age of Onset   Heart attack Mother    Heart attack Father    Heart disease Brother    Heart disease Brother    Colon cancer Neg Hx    Stomach cancer Neg Hx    Esophageal cancer Neg Hx    Pancreatic cancer Neg Hx     Past Surgical History:  Procedure Laterality Date   ABDOMINAL HYSTERECTOMY     age 24, done due to endometriosis, ovaries remain   BREAST LUMPECTOMY Right 2004   breast cancer   BREAST LUMPECTOMY Left 2009   Lipoma, Dr. Jamey Ripa   MOLE REMOVAL  number of years prior to 2004   early melanoma   Social History   Occupational History   Occupation: retired  Tobacco Use   Smoking status: Never   Smokeless tobacco: Never  Vaping Use   Vaping status: Never Used  Substance and Sexual Activity   Alcohol use: No   Drug use: No   Sexual activity: Not Currently

## 2023-06-03 ENCOUNTER — Other Ambulatory Visit: Payer: Self-pay

## 2023-06-03 ENCOUNTER — Ambulatory Visit: Payer: Medicare Other | Admitting: Physical Medicine and Rehabilitation

## 2023-06-03 VITALS — BP 143/77 | HR 63

## 2023-06-03 DIAGNOSIS — M5416 Radiculopathy, lumbar region: Secondary | ICD-10-CM | POA: Diagnosis not present

## 2023-06-03 MED ORDER — METHYLPREDNISOLONE ACETATE 80 MG/ML IJ SUSP
80.0000 mg | Freq: Once | INTRAMUSCULAR | Status: AC
Start: 2023-06-03 — End: 2023-06-03
  Administered 2023-06-03: 80 mg

## 2023-06-03 NOTE — Patient Instructions (Signed)

## 2023-06-03 NOTE — Progress Notes (Signed)
Sara Wells - 82 y.o. female MRN [id number]  Date of birth: 01/15/1941  Office Visit Note: Visit Date: 06/03/2023 PCP: Gaspar Garbe, MD Referred by: Gaspar Garbe, MD  Subjective: Chief Complaint  Patient presents with   Lower Back - Pain   HPI:  Sara Wells is a 82 y.o. female who comes in today at the request of Rexene Edison, PA-C for planned Right L4-5 Lumbar Transforaminal epidural steroid injection with fluoroscopic guidance.  The patient has failed conservative care including home exercise, medications, time and activity modification.  This injection will be diagnostic and hopefully therapeutic.  Please see requesting physician notes for further details and justification. Consider new MRI.   ROS Otherwise per HPI.  Assessment & Plan: Visit Diagnoses:    ICD-10-CM   1. Lumbar radiculopathy  M54.16 XR C-ARM NO REPORT    Epidural Steroid injection    methylPREDNISolone acetate (DEPO-MEDROL) injection 80 mg      Plan: No additional findings.   Meds & Orders:  Meds ordered this encounter  Medications   methylPREDNISolone acetate (DEPO-MEDROL) injection 80 mg    Orders Placed This Encounter  Procedures   XR C-ARM NO REPORT   Epidural Steroid injection    Follow-up: Return for visit to requesting provider as scheduled.   Procedures: No procedures performed  Lumbosacral Transforaminal Epidural Steroid Injection - Sub-Pedicular Approach with Fluoroscopic Guidance  Patient: Sara Wells      Date of Birth: May 10, 1941 MRN: [id number] PCP: Gaspar Garbe, MD      Visit Date: 06/03/2023   Universal Protocol:    Date/Time: 06/03/2023  Consent Given By: the patient  Position: PRONE  Additional Comments: Vital signs were monitored before and after the procedure. Patient was prepped and draped in the usual sterile fashion. The correct patient, procedure, and site was verified.   Injection Procedure Details:   Procedure diagnoses: Lumbar  radiculopathy [M54.16]    Meds Administered:  Meds ordered this encounter  Medications   methylPREDNISolone acetate (DEPO-MEDROL) injection 80 mg    Laterality: Right  Location/Site: L4  Needle:5.0 in., 22 ga.  Short bevel or Quincke spinal needle  Needle Placement: Transforaminal  Findings:    -Comments: Excellent flow of contrast along the nerve, nerve root and into the epidural space.  Procedure Details: After squaring off the end-plates to get a true AP view, the C-arm was positioned so that an oblique view of the foramen as noted above was visualized. The target area is just inferior to the "nose of the scotty dog" or sub pedicular. The soft tissues overlying this structure were infiltrated with 2-3 ml. of 1% Lidocaine without Epinephrine.  The spinal needle was inserted toward the target using a "trajectory" view along the fluoroscope beam.  Under AP and lateral visualization, the needle was advanced so it did not puncture dura and was located close the 6 O'Clock position of the pedical in AP tracterory. Biplanar projections were used to confirm position. Aspiration was confirmed to be negative for CSF and/or blood. A 1-2 ml. volume of Isovue-250 was injected and flow of contrast was noted at each level. Radiographs were obtained for documentation purposes.   After attaining the desired flow of contrast documented above, a 0.5 to 1.0 ml test dose of 0.25% Marcaine was injected into each respective transforaminal space.  The patient was observed for 90 seconds post injection.  After no sensory deficits were reported, and normal lower extremity motor function was noted,  the above injectate was administered so that equal amounts of the injectate were placed at each foramen (level) into the transforaminal epidural space.   Additional Comments:  No complications occurred Dressing: 2 x 2 sterile gauze and Band-Aid    Post-procedure details: Patient was observed during the  procedure. Post-procedure instructions were reviewed.  Patient left the clinic in stable condition.    Clinical History: CLINICAL DATA:  Low back pain radiating into the posterior right leg to the foot for 8 months. No known injury.   EXAM: MRI LUMBAR SPINE WITHOUT CONTRAST   TECHNIQUE: Multiplanar, multisequence MR imaging of the lumbar spine was performed. No intravenous contrast was administered.   COMPARISON:  Plain films lumbar spine performed at [facility] 02/25/2017.   FINDINGS: Segmentation:  Standard.   Alignment: Since the prior MRI, the patient has developed facet mediated 0.5 cm anterolisthesis L4 on L5. Alignment is otherwise normal.   Vertebrae: No fracture or worrisome lesion. Degenerative endplate signal change anteriorly at T12-L1 is noted.   Conus medullaris: Extends to the L1 level and appears normal.   Paraspinal and other soft tissues: Negative.   Disc levels:   T10-11 is imaged in the sagittal plane only. There is a minimal disc bulge without stenosis.   T11-12: Minimal disc bulge without stenosis.   T12-L1:  Tiny disc bulge without stenosis.   L1-2:  Tiny disc bulge without stenosis.   L2-3: Mild facet degenerative change. No disc bulge or protrusion. The central canal and foramina are widely patent.   L3-4: Moderate facet arthropathy is worse than on the prior MRI. There is a shallow disc bulge. Mild to moderate central canal and bilateral subarticular recess narrowing is identified. The foramina are open. No nerve root compression is seen.   L4-5: There has been progression of disease at this level since the prior MRI. Bilateral facet joint effusions are identified. The disc is uncovered and bulging with a superimposed cephalad extending central and left paracentral protrusion indenting the ventral thecal sac and contributing to encroachment on the descending left L5 root. Severe central canal and bilateral subarticular  recess narrowing is present. Protrusion in the periphery of the left foramen contacts the exiting left L4 root. The right foramen is open.   L5-S1: Moderate bilateral facet degenerative disease. Minimal disc bulge without central canal or foraminal stenosis.   IMPRESSION: Cephalad extending left paracentral protrusion at L4-5 contributes to severe central canal and bilateral subarticular recess narrowing in conjunction with advanced facet arthropathy and ligamentum flavum thickening. The patient also has a protrusion in the far periphery of the left foramen which contacts the exiting left L4 root at this level. Spondylosis has worsened since the prior MRI.   Mild to moderate central canal and bilateral subarticular recess narrowing at L3-4. The appearance is worse than on the prior MRI.     Electronically Signed   By: Drusilla Kanner M.D.   On: 03/08/2017 18:38     Objective:  VS:  HT:    WT:   BMI:     BP:(!) 143/77  HR:63bpm  TEMP: ( )  RESP:  Physical Exam Vitals and nursing note reviewed.  Constitutional:      General: She is not in acute distress.    Appearance: Normal appearance. She is not ill-appearing.  HENT:     Head: Normocephalic and atraumatic.     Right Ear: External ear normal.     Left Ear: External ear normal.  Eyes:  Extraocular Movements: Extraocular movements intact.  Cardiovascular:     Rate and Rhythm: Normal rate.     Pulses: Normal pulses.  Pulmonary:     Effort: Pulmonary effort is normal. No respiratory distress.  Abdominal:     General: There is no distension.     Palpations: Abdomen is soft.  Musculoskeletal:        General: Tenderness present.     Cervical back: Neck supple.     Right lower leg: No edema.     Left lower leg: No edema.     Comments: Patient has good distal strength with no pain over the greater trochanters.  No clonus or focal weakness.  Skin:    Findings: No erythema, lesion or rash.  Neurological:      General: No focal deficit present.     Mental Status: She is alert and oriented to person, place, and time.     Sensory: No sensory deficit.     Motor: No weakness or abnormal muscle tone.     Coordination: Coordination normal.  Psychiatric:        Mood and Affect: Mood normal.        Behavior: Behavior normal.      Imaging: No results found.

## 2023-06-03 NOTE — Procedures (Signed)
Lumbosacral Transforaminal Epidural Steroid Injection - Sub-Pedicular Approach with Fluoroscopic Guidance  Patient: Sara Wells      Date of Birth: 02/05/1941 MRN: [id number] PCP: Gaspar Garbe, MD      Visit Date: 06/03/2023   Universal Protocol:    Date/Time: 06/03/2023  Consent Given By: the patient  Position: PRONE  Additional Comments: Vital signs were monitored before and after the procedure. Patient was prepped and draped in the usual sterile fashion. The correct patient, procedure, and site was verified.   Injection Procedure Details:   Procedure diagnoses: Lumbar radiculopathy [M54.16]    Meds Administered:  Meds ordered this encounter  Medications   methylPREDNISolone acetate (DEPO-MEDROL) injection 80 mg    Laterality: Right  Location/Site: L4  Needle:5.0 in., 22 ga.  Short bevel or Quincke spinal needle  Needle Placement: Transforaminal  Findings:    -Comments: Excellent flow of contrast along the nerve, nerve root and into the epidural space.  Procedure Details: After squaring off the end-plates to get a true AP view, the C-arm was positioned so that an oblique view of the foramen as noted above was visualized. The target area is just inferior to the "nose of the scotty dog" or sub pedicular. The soft tissues overlying this structure were infiltrated with 2-3 ml. of 1% Lidocaine without Epinephrine.  The spinal needle was inserted toward the target using a "trajectory" view along the fluoroscope beam.  Under AP and lateral visualization, the needle was advanced so it did not puncture dura and was located close the 6 O'Clock position of the pedical in AP tracterory. Biplanar projections were used to confirm position. Aspiration was confirmed to be negative for CSF and/or blood. A 1-2 ml. volume of Isovue-250 was injected and flow of contrast was noted at each level. Radiographs were obtained for documentation purposes.   After attaining the desired flow  of contrast documented above, a 0.5 to 1.0 ml test dose of 0.25% Marcaine was injected into each respective transforaminal space.  The patient was observed for 90 seconds post injection.  After no sensory deficits were reported, and normal lower extremity motor function was noted,   the above injectate was administered so that equal amounts of the injectate were placed at each foramen (level) into the transforaminal epidural space.   Additional Comments:  No complications occurred Dressing: 2 x 2 sterile gauze and Band-Aid    Post-procedure details: Patient was observed during the procedure. Post-procedure instructions were reviewed.  Patient left the clinic in stable condition.

## 2023-06-03 NOTE — Progress Notes (Signed)
Functional Pain Scale - descriptive words and definitions  Uncomfortable (3)  Pain is present but can complete all ADL's/sleep is slightly affected and passive distraction only gives marginal relief. Mild range order  Average Pain 7   +Driver, -BT, -Dye Allergies.  Lower back pain on both sides, mainly on the right side that radiates into the right leg

## 2023-06-11 ENCOUNTER — Telehealth: Payer: Self-pay | Admitting: Physical Medicine and Rehabilitation

## 2023-06-11 NOTE — Telephone Encounter (Signed)
Pt called needing to cancel appt with Dr Alvester Morin. No call back needed, just cancel

## 2023-06-18 ENCOUNTER — Ambulatory Visit: Payer: Medicare Other | Admitting: Physical Medicine and Rehabilitation

## 2023-11-04 ENCOUNTER — Ambulatory Visit: Payer: Medicare Other | Admitting: Adult Health

## 2023-11-07 DIAGNOSIS — M7989 Other specified soft tissue disorders: Secondary | ICD-10-CM | POA: Insufficient documentation

## 2023-11-07 NOTE — Progress Notes (Unsigned)
  Cardiology Office Note:   Date:  11/08/2023  ID:  Sara Wells, DOB Jul 30, 1941, MRN [id number] PCP: Gaspar Garbe, MD  [facility] Providers Cardiologist:  Rollene Rotunda, MD {  History of Present Illness:   Sara Wells is a 83 y.o. female who presents for evaluation of chronic lower extremity swelling.  She had a work up with studies including negative perfusion study in 2019.  Echocardiogram demonstrated well-preserved ejection fraction at that time with some questionable diastolic dysfunction mildly.  She is also had moderate obstructive disease on PFTs.    She presents for follow up.  She has had increasing dyspnea on exertion and sometimes even at rest.  She is not describing PND or orthopnea.  She is not describing palpitations, presyncope or syncope.  She feels like her chest is full but this is kind of a chronic issue when she is short of breath.  She has not had any new cough fevers or chills.  Her weights have been stable though.  Her lower extremity swelling has been unchanged.  Of note we did walk around the office and her oxygen saturation was around 90% or 89% but then when we walked around it actually went up.  She did feel dyspneic.    ROS: As stated in the HPI and negative for all other systems.  Studies Reviewed:    EKG:   EKG Interpretation Date/Time:  Friday November 08 2023 13:50:53 EDT Ventricular Rate:  64 PR Interval:  138 QRS Duration:  70 QT Interval:  390 QTC Calculation: 402 R Axis:   23  Text Interpretation: Normal sinus rhythm Possible Anterior infarct , age undetermined When compared with ECG of 15-Jul-2008 07:49, No significant change was found Confirmed by Rollene Rotunda ([id number]) on 11/08/2023 1:52:13 PM    Risk Assessment/Calculations:              Physical Exam:   VS:  BP 128/74 (BP Location: Left Arm, Patient Position: Sitting)   Pulse 79   Ht 5' (1.524 m)   Wt 197 lb 12.8 oz (89.7 kg)   SpO2 (!) 89%   BMI 38.63 kg/m    Wt  Readings from Last 3 Encounters:  11/08/23 197 lb 12.8 oz (89.7 kg)  11/09/22 188 lb 3.2 oz (85.4 kg)  03/16/22 194 lb (88 kg)     GEN: Well nourished, well developed in no acute distress NECK: No JVD; No carotid bruits CARDIAC: RRR, no murmurs, rubs, gallops RESPIRATORY:  Clear to auscultation without rales, wheezing or rhonchi  ABDOMEN: Soft, non-tender, non-distended EXTREMITIES:  No edema; No deformity   ASSESSMENT AND PLAN:   Hypertension:   Her blood pressure is at target.  No change in therapy.   Dyspnea: I am going to exclude ischemia with PET perfusion imaging.  This certainly could be an anginal equivalent.  The echo was unremarkable including no evidence of elevated pulmonary pressures and normal left ventricular function.  She did have a mildly elevated BNP and I would ask her to start taking Lasix daily.  She is only been taking this as needed.  I am going to make the referral to pulmonary.  That was to be done previously but for some reason it did not happen.    Edema:  This is unchanged from previous.  Plan as above.   Follow up with Joni Reining DNP in about 3 months.   Signed, Rollene Rotunda, MD

## 2023-11-08 ENCOUNTER — Encounter: Payer: Self-pay | Admitting: Cardiology

## 2023-11-08 ENCOUNTER — Ambulatory Visit: Attending: Cardiology | Admitting: Cardiology

## 2023-11-08 VITALS — BP 128/74 | HR 79 | Ht 60.0 in | Wt 197.8 lb

## 2023-11-08 DIAGNOSIS — M7989 Other specified soft tissue disorders: Secondary | ICD-10-CM

## 2023-11-08 DIAGNOSIS — R0602 Shortness of breath: Secondary | ICD-10-CM

## 2023-11-08 DIAGNOSIS — I10 Essential (primary) hypertension: Secondary | ICD-10-CM

## 2023-11-08 MED ORDER — FUROSEMIDE 20 MG PO TABS: 20.0000 mg | ORAL_TABLET | Freq: Every day | ORAL | 3 refills | Status: AC

## 2023-11-08 NOTE — Patient Instructions (Signed)
 Medication Instructions:  Lasix 20 mg daily. New script sent. *If you need a refill on your cardiac medications before your next appointment, please call your pharmacy*  Testing/Procedures:    Please report to Radiology at the [facility] Main Entrance 30 minutes early for your test.  [address] [place], [place] [address]                        How to Prepare for Your Cardiac PET/CT Stress Test:  Nothing to eat or drink, except water, 3 hours prior to arrival time.  NO caffeine/decaffeinated products, or chocolate 12 hours prior to arrival. (Please note decaffeinated beverages (teas/coffees) still contain caffeine).  If you have caffeine within 12 hours prior, the test will need to be rescheduled.  Medication instructions: Do not take erectile dysfunction medications for 72 hours prior to test (sildenafil, tadalafil) Do not take nitrates (isosorbide mononitrate, Ranexa) the day before or day of test Do not take tamsulosin the day before or morning of test Hold theophylline containing medications for 12 hours. Hold Dipyridamole 48 hours prior to the test.  Diabetic Preparation: If able to eat breakfast prior to 3 hour fasting, you may take all medications, including your insulin. Do not worry if you miss your breakfast dose of insulin - start at your next meal. If you do not eat prior to 3 hour fast-Hold all diabetes (oral and insulin) medications. Patients who wear a continuous glucose monitor MUST remove the device prior to scanning.  You may take your remaining medications with water.  NO perfume, cologne or lotion on chest or abdomen area. FEMALES - Please avoid wearing dresses to this appointment.  Total time is 1 to 2 hours; you may want to bring reading material for the waiting time.  IF YOU THINK YOU MAY BE PREGNANT, OR ARE NURSING PLEASE INFORM THE TECHNOLOGIST.  In preparation for your appointment, medication and supplies will be purchased.  Appointment  availability is limited, so if you need to cancel or reschedule, please call the Radiology Department Scheduler at [phone] 24 hours in advance to avoid a cancellation fee of $100.00  What to Expect When you Arrive:  Once you arrive and check in for your appointment, you will be taken to a preparation room within the Radiology Department.  A technologist or Nurse will obtain your medical history, verify that you are correctly prepped for the exam, and explain the procedure.  Afterwards, an IV will be started in your arm and electrodes will be placed on your skin for EKG monitoring during the stress portion of the exam. Then you will be escorted to the PET/CT scanner.  There, staff will get you positioned on the scanner and obtain a blood pressure and EKG.  During the exam, you will continue to be connected to the EKG and blood pressure machines.  A small, safe amount of a radioactive tracer will be injected in your IV to obtain a series of pictures of your heart along with an injection of a stress agent.    After your Exam:  It is recommended that you eat a meal and drink a caffeinated beverage to counter act any effects of the stress agent.  Drink plenty of fluids for the remainder of the day and urinate frequently for the first couple of hours after the exam.  Your doctor will inform you of your test results within 7-10 business days.  For more information and frequently asked  questions, please visit our website: [email or web address]  For questions about your test or how to prepare for your test, please call: Cardiac Imaging Nurse Navigators Office: [phone]   Follow-Up: At [facility], you and your health needs are our priority.  As part of our continuing mission to provide you with exceptional heart care, our providers are all part of one team.  This team includes your primary Cardiologist (physician) and Advanced Practice Providers or APPs (Physician Assistants  and Nurse Practitioners) who all work together to provide you with the care you need, when you need it.  Your next appointment:   3 month(s)  Provider:   Joni Reining, DNP, ANP    We recommend signing up for the patient portal called "MyChart".  Sign up information is provided on this After Visit Summary.  MyChart is used to connect with patients for Virtual Visits (Telemedicine).  Patients are able to view lab/test results, encounter notes, upcoming appointments, etc.  Non-urgent messages can be sent to your provider as well.   To learn more about what you can do with MyChart, go to [email or web address].   Other Instructions       1st Floor: - Lobby - Registration  - Pharmacy  - Lab - Cafe  2nd Floor: - PV Lab - Diagnostic Testing (echo, CT, nuclear med)  3rd Floor: - Vacant  4th Floor: - TCTS (cardiothoracic surgery) - AFib Clinic - Structural Heart Clinic - Vascular Surgery  - Vascular Ultrasound  5th Floor: - HeartCare Cardiology (general and EP) - Clinical Pharmacy for coumadin, hypertension, lipid, weight-loss medications, and med management appointments    Valet parking services will be available as well.

## 2023-11-12 ENCOUNTER — Encounter: Payer: Self-pay | Admitting: Cardiology

## 2023-11-12 ENCOUNTER — Ambulatory Visit: Payer: Medicare Other | Admitting: Adult Health

## 2023-11-12 NOTE — Telephone Encounter (Signed)
error 

## 2023-12-09 ENCOUNTER — Other Ambulatory Visit (HOSPITAL_COMMUNITY): Payer: Self-pay | Admitting: *Deleted

## 2023-12-09 ENCOUNTER — Telehealth (HOSPITAL_COMMUNITY): Payer: Self-pay | Admitting: *Deleted

## 2023-12-09 DIAGNOSIS — R0602 Shortness of breath: Secondary | ICD-10-CM

## 2023-12-09 NOTE — Telephone Encounter (Signed)
Reaching out to patient to offer assistance regarding upcoming cardiac imaging study; pt verbalizes understanding of appt date/time, parking situation and where to check in, pre-test NPO status, and verified current allergies; name and call back number provided for further questions should they arise  Sara Dunbar RN Navigator Cardiac [facility] [phone] office [phone] cell  Patient aware to avoid caffeine 12 hours prior to her cardiac PET scan.  

## 2023-12-10 ENCOUNTER — Ambulatory Visit (HOSPITAL_COMMUNITY)
Admission: RE | Admit: 2023-12-10 | Discharge: 2023-12-10 | Disposition: A | Discharge status: Home / Self Care | Source: Ambulatory Visit | Attending: Cardiology | Admitting: Cardiology

## 2023-12-10 DIAGNOSIS — R0602 Shortness of breath: Secondary | ICD-10-CM | POA: Insufficient documentation

## 2023-12-10 LAB — NM PET CT CARDIAC PERFUSION MULTI W/ABSOLUTE BLOODFLOW
LV dias vol: 75 mL (ref 46–106)
LV sys vol: 34 mL
MBFR: 2.84
Nuc Rest EF: 55 %
Nuc Stress EF: 59 %
Peak HR: 75 {beats}/min
Rest HR: 57 {beats}/min
Rest MBF: 0.67 ml/g/min
Rest Nuclear Isotope Dose: 23.1 mCi
ST Depression (mm): 0 mm
Stress MBF: 1.9 ml/g/min
Stress Nuclear Isotope Dose: 23.1 mCi

## 2023-12-10 MED ORDER — RUBIDIUM RB82 GENERATOR (RUBYFILL)
23.1300 | PACK | Freq: Once | INTRAVENOUS | Status: AC
  Administered 2023-12-10: 23.13 via INTRAVENOUS

## 2023-12-10 MED ORDER — REGADENOSON 0.4 MG/5ML IV SOLN
INTRAVENOUS | Status: AC
  Filled 2023-12-10: qty 5

## 2023-12-10 MED ORDER — RUBIDIUM RB82 GENERATOR (RUBYFILL)
23.1200 | PACK | Freq: Once | INTRAVENOUS | Status: AC
Start: 2023-12-10 — End: 2023-12-10
  Administered 2023-12-10: 23.12 via INTRAVENOUS

## 2023-12-10 MED ORDER — REGADENOSON 0.4 MG/5ML IV SOLN
0.4000 mg | Freq: Once | INTRAVENOUS | Status: AC
  Administered 2023-12-10: 0.4 mg via INTRAVENOUS

## 2023-12-13 ENCOUNTER — Encounter: Payer: Self-pay | Admitting: Emergency Medicine

## 2023-12-13 ENCOUNTER — Ambulatory Visit: Admitting: Emergency Medicine

## 2023-12-13 VITALS — BP 122/63 | HR 66 | Ht 60.0 in | Wt 198.4 lb

## 2023-12-13 DIAGNOSIS — R0689 Other abnormalities of breathing: Secondary | ICD-10-CM | POA: Diagnosis not present

## 2023-12-13 DIAGNOSIS — R0600 Dyspnea, unspecified: Secondary | ICD-10-CM

## 2023-12-13 NOTE — Assessment & Plan Note (Signed)
 Exertional dyspnea Exertional dyspnea with normal cardiac evaluation except for possible diastolic dysfunction. Pulmonary function testing in 2019 indicated mild obstruction with bronchodilator response, suggesting possible mild asthma. Nocturnal wheezing present. Differential includes mild asthma. She prefers to avoid daily medication unless necessary.  Did not take Breztri reliably - Order repeat pulmonary function testing to evaluate for mild asthma pattern. - Discuss potential inhaled medication trial based on test results.  Possible mild asthma 2019 pulmonary function tests suggested mild asthma with mild obstruction and bronchodilator response. Symptoms include exertional dyspnea and nocturnal wheezing. Previous Breztri trial caused oral thrush. Discussed albuterol  for symptomatic relief without thrush risk. - Consider albuterol  for symptomatic relief if needed.  She wants to hold off on this for now, wait for the PFT results

## 2023-12-13 NOTE — Patient Instructions (Signed)
 VISIT SUMMARY:  Today, you were seen for exertional shortness of breath. We discussed your symptoms, including shortness of breath during activities and occasional nocturnal wheezing. We reviewed your history of hypertension, peripheral edema, and past breast cancer. We also discussed your previous experiences with Breztri and your concerns about medication use.  YOUR PLAN:  -EXERTIONAL DYSPNEA: Exertional dyspnea means experiencing shortness of breath during physical activities. We will repeat your pulmonary function testing to check for mild asthma and discuss potential inhaled medications based on the results.  -POSSIBLE MILD ASTHMA: Mild asthma is a condition where the airways occasionally become inflamed and narrow, causing breathing difficulties. We will consider using albuterol  for symptomatic relief if needed.  -PERIPHERAL EDEMA: Peripheral edema is swelling in the lower limbs, often due to fluid retention. You are currently taking Lasix , which helps with the swelling but not with your breathing.  -HYPERTENSION: Hypertension is high blood pressure. Your condition is being managed well, and no changes are needed at this time.   INSTRUCTIONS:  We will order a pulmonary function test and schedule a follow-up appointment to review the results and discuss treatment options.

## 2023-12-13 NOTE — Progress Notes (Signed)
 Subjective:    Patient ID: Sara Wells, female    DOB: 11-Oct-1940, 83 y.o.   MRN: [id number]  HPI Sara Wells is an 83 year old female with hypertension who presents with exertional shortness of breath. She was referred by Dr. Lavonne Prairie for evaluation of exertional shortness of breath.  She experiences shortness of breath during activities such as walking and housework, necessitating pauses to catch her breath. No shortness of breath occurs while sitting still or during sleep, and she sleeps well without waking due to breathing difficulties. She occasionally hears a noise when breathing at night, which resolves when she changes position.  She has been on Breztri for about two years but does not use it daily due to concerns about medication overuse and perceived lack of benefit. She experienced thrush when she first started using it, which led to discontinuation of regular use.  She has a history of hypertension and was started on Lasix  due to questionable diastolic dysfunction and mildly elevated BNP. Lasix  has helped with swelling in her feet, which tends to worsen by the afternoon, but it has not improved her breathing.  Her past medical workup includes an echocardiogram on Dec 11, 2022, showing questionable diastolic dysfunction, and a nuclear medicine cardiac perfusion study on Dec 10, 2023, which showed normal perfusion and a resting ejection fraction of 55%. Pulmonary function testing from June 02, 2018, indicated normal baseline spirometry with evidence of obstruction based on a positive bronchodilator response, normal lung volumes, and normal diffusion capacity.  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 She has never smoked or consumed alcohol and has no history of asthma.   DIAGNOSTIC Echocardiogram: Questionable diastolic dysfunction  (12/11/2022)  Pulmonary function testing: Normal baseline spirometry with evidence of obstruction, normal lung volumes, normal diffusion capacity (06/02/2018)  Nuclear medicine cardiac perfusion: Normal perfusion, resting ejection fraction 55%, subtle subpleural radiation fibrosis in the anterior right lung (12/10/2023)   Review of Systems As per HPI  Past Medical History:  Diagnosis Date   Arthritis    right hand   Breast cancer (HCC) 10/05/2002   right   Endometriosis    GERD (gastroesophageal reflux disease)    Hypertension    SOB (shortness of breath)      Family History  Problem Relation Age of Onset   Heart attack Mother    Heart attack Father    Heart disease Brother    Heart disease Brother    Colon cancer Neg Hx    Stomach cancer Neg Hx    Esophageal cancer Neg Hx    Pancreatic cancer Neg Hx      Social History   Socioeconomic History   Marital status: Single    Spouse name: Not on file   Number of children: Not on file   Years of education: Not on file   Highest education level: Not on file  Occupational History   Occupation: retired  Tobacco Use   Smoking status: Never   Smokeless tobacco: Never  Vaping Use   Vaping status: Never Used  Substance and Sexual Activity   Alcohol use: No   Drug use: No   Sexual activity: Not Currently  Other Topics Concern   Not on file  Social History Narrative   Not on file   Social Drivers of Health   Financial Resource Strain: Not on file  Food Insecurity: Not on file  Transportation Needs: Not on file  Physical Activity: Not on file  Stress: Not on file  Social Connections: Not on file  Intimate Partner Violence: Not on file     Allergies  Allergen Reactions   Codeine Rash     Outpatient Medications Prior to Visit  Medication Sig Dispense Refill   Budeson-Glycopyrrol-Formoterol (BREZTRI AEROSPHERE) 160-9-4.8 MCG/ACT AERO Use 2 puffs twice daily Inhalation     D 1000 25 MCG (1000 UT) capsule  SMARTSIG:1 By Mouth     furosemide  (LASIX ) 20 MG tablet Take 1 tablet (20 mg total) by mouth daily. 90 tablet 3   hydrochlorothiazide  (HYDRODIURIL ) 12.5 MG tablet Take 12.5 mg by mouth daily.     losartan (COZAAR) 50 MG tablet Take 50 mg by mouth daily.     sertraline  (ZOLOFT ) 25 MG tablet Take 1 tablet (25 mg total) by mouth daily. 90 tablet 1   vitamin B-12 (CYANOCOBALAMIN) 1000 MCG tablet Take 1,000 mcg by mouth daily.     Wheat Dextrin (BENEFIBER) POWD Use as directed, daily  0   Albuterol  Sulfate, sensor, (PROAIR  DIGIHALER) 108 (90 Base) MCG/ACT AEPB 2 inhalations qid prn SOB Inhalation (Patient not taking: Reported on 12/13/2023)     No facility-administered medications prior to visit.         Objective:   Physical Exam  Vitals:   12/13/23 0929  BP: 122/63  Pulse: 66  SpO2: 92%  Weight: 198 lb 6.4 oz (90 kg)  Height: 5' (1.524 m)   Gen: Pleasant, overweight woman, in no distress,  normal affect  ENT: No lesions,  mouth clear,  oropharynx clear, no postnasal drip  Neck: No JVD, no stridor  Lungs: No use of accessory muscles, decreased to both ASIS.  No crackles or wheezes  Cardiovascular: RRR, heart sounds normal, no murmur or gallops, trace ankle peripheral edema  Musculoskeletal: No deformities, no cyanosis or clubbing  Neuro: alert, awake, non focal  Skin: Warm, no lesions or rash      Assessment & Plan:  Dyspnea and respiratory abnormalities Exertional dyspnea Exertional dyspnea with normal cardiac evaluation except for possible diastolic dysfunction. Pulmonary function testing in 2019 indicated mild obstruction with bronchodilator response, suggesting possible mild asthma. Nocturnal wheezing present. Differential includes mild asthma. She prefers to avoid daily medication unless necessary.  Did not take Breztri reliably - Order repeat pulmonary function testing to evaluate for mild asthma pattern. - Discuss potential inhaled medication trial based on test  results.  Possible mild asthma 2019 pulmonary function tests suggested mild asthma with mild obstruction and bronchodilator response. Symptoms include exertional dyspnea and nocturnal wheezing. Previous Breztri trial caused oral thrush. Discussed albuterol  for symptomatic relief without thrush risk. - Consider albuterol  for symptomatic relief if needed.  She wants to hold off on this for now, wait for the PFT results    Racheal Buddle, MD, PhD 12/13/2023, 10:05 AM [facility] [phone] or if no answer before 7:00PM call [phone] For any issues after 7:00PM please call eLink [phone]

## 2023-12-13 NOTE — Progress Notes (Deleted)
Patient seen in the office today and instructed on use of Stiolto and Albuterol.  Patient expressed understanding and demonstrated technique.

## 2024-02-04 ENCOUNTER — Other Ambulatory Visit (HOSPITAL_COMMUNITY)

## 2024-03-11 ENCOUNTER — Other Ambulatory Visit: Payer: Self-pay | Admitting: *Deleted

## 2024-03-11 DIAGNOSIS — R0600 Dyspnea, unspecified: Secondary | ICD-10-CM

## 2024-03-12 ENCOUNTER — Ambulatory Visit: Admitting: Emergency Medicine

## 2024-03-12 ENCOUNTER — Encounter: Payer: Self-pay | Admitting: Emergency Medicine

## 2024-03-12 VITALS — BP 124/57 | HR 73 | Temp 97.8°F | Ht 63.0 in | Wt 198.0 lb

## 2024-03-12 DIAGNOSIS — R0600 Dyspnea, unspecified: Secondary | ICD-10-CM | POA: Diagnosis not present

## 2024-03-12 DIAGNOSIS — R0689 Other abnormalities of breathing: Secondary | ICD-10-CM

## 2024-03-12 DIAGNOSIS — J4520 Mild intermittent asthma, uncomplicated: Secondary | ICD-10-CM

## 2024-03-12 LAB — PULMONARY FUNCTION TEST
DL/VA % pred: 108 %
DL/VA: 4.52 ml/min/mmHg/L
DLCO cor % pred: 92 %
DLCO cor: 15.45 ml/min/mmHg
DLCO unc % pred: 92 %
DLCO unc: 15.45 ml/min/mmHg
FEF 25-75 Post: 2.38 L/s
FEF 25-75 Pre: 1.62 L/s
FEF2575-%Change-Post: 46 %
FEF2575-%Pred-Post: 219 %
FEF2575-%Pred-Pre: 149 %
FEV1-%Change-Post: 8 %
FEV1-%Pred-Post: 106 %
FEV1-%Pred-Pre: 97 %
FEV1-Post: 1.65 L
FEV1-Pre: 1.52 L
FEV1FVC-%Change-Post: -6 %
FEV1FVC-%Pred-Pre: 113 %
FEV6-%Change-Post: 16 %
FEV6-%Pred-Post: 106 %
FEV6-%Pred-Pre: 91 %
FEV6-Post: 2.12 L
FEV6-Pre: 1.82 L
FEV6FVC-%Pred-Post: 106 %
FEV6FVC-%Pred-Pre: 106 %
FVC-%Change-Post: 16 %
FVC-%Pred-Post: 99 %
FVC-%Pred-Pre: 85 %
FVC-Post: 2.12 L
FVC-Pre: 1.82 L
Post FEV1/FVC ratio: 78 %
Post FEV6/FVC ratio: 100 %
Pre FEV1/FVC ratio: 83 %
Pre FEV6/FVC Ratio: 100 %
RV % pred: 100 %
RV: 2.3 L
TLC % pred: 95 %
TLC: 4.39 L

## 2024-03-12 MED ORDER — ALBUTEROL SULFATE HFA 108 (90 BASE) MCG/ACT IN AERS: 2.0000 | INHALATION_SPRAY | Freq: Four times a day (QID) | RESPIRATORY_TRACT | 2 refills | Status: AC | PRN

## 2024-03-12 NOTE — Progress Notes (Signed)
   Subjective:    Patient ID: Sara Wells, female    DOB: 1940-09-21, 83 y.o.   MRN: [id number]  HPI  ROV 03/12/2024 --follow-up visit for 83 year old woman, never smoker, with exertional shortness of breath.  She does have possible diastolic dysfunction and possible mild obstructive lung disease on pulmonary function testing in 2019.  We decided to repeat her PFT before trial of BD.    Pulmonary function testing performed today and reviewed by me show overall normal airflows but with a positive bronchodilator response consistent with mild asthma.  Her lung volumes are normal.  Her diffusion capacity is normal.   DIAGNOSTIC Echocardiogram: Questionable diastolic dysfunction (12/11/2022)  Pulmonary function testing: Normal baseline spirometry with evidence of obstruction, normal lung volumes, normal diffusion capacity (06/02/2018)  Nuclear medicine cardiac perfusion: Normal perfusion, resting ejection fraction 55%, subtle subpleural radiation fibrosis in the anterior right lung (12/10/2023)   Review of Systems As per HPI  Past Medical History:  Diagnosis Date   Arthritis    right hand   Breast cancer (HCC) 10/05/2002   right   Endometriosis    GERD (gastroesophageal reflux disease)    Hypertension    SOB (shortness of breath)         Objective:   Physical Exam  Vitals:   03/12/24 1518  BP: (!) 124/57  Pulse: 73  Temp: 97.8 F (36.6 C)  TempSrc: Temporal  SpO2: 98%  Weight: 198 lb (89.8 kg)  Height: 5' 3 (1.6 m)   Gen: Pleasant, overweight woman, in no distress,  normal affect  ENT: No lesions,  mouth clear,  oropharynx clear, no postnasal drip  Neck: No JVD, no stridor  Lungs: No use of accessory muscles, decreased at both bases.  No crackles or wheezes  Cardiovascular: RRR, heart sounds normal, no murmur or gallops, trace ankle peripheral edema  Musculoskeletal: No deformities, no cyanosis or clubbing  Neuro: alert, awake, non focal  Skin: Warm, no  lesions or rash      Assessment & Plan:  Mild intermittent asthma Mild obstruction on her PFT based on positive bronchodilator response.  Her FEV1 and FVC both fall into the normal range.  She is willing to try pretreating exertion with albuterol .  I will renew her prescription for this.  Plan to follow-up if she has any new respiratory complaints.   Lamar Chris, MD, PhD 03/12/2024, 4:02 PM [facility] [phone] or if no answer before 7:00PM call [phone] For any issues after 7:00PM please call eLink [phone]

## 2024-03-12 NOTE — Assessment & Plan Note (Signed)
 Mild obstruction on her PFT based on positive bronchodilator response.  Her FEV1 and FVC both fall into the normal range.  She is willing to try pretreating exertion with albuterol .  I will renew her prescription for this.  Plan to follow-up if she has any new respiratory complaints.

## 2024-03-12 NOTE — Patient Instructions (Signed)
 Full PFT performed today.

## 2024-03-12 NOTE — Patient Instructions (Signed)
 We reviewed your pulmonary function testing today. We will renew your prescription for albuterol .  You can use 2 puffs if you needed for shortness of breath, chest tightness or wheezing.  You might want to try taking 2 puffs about 10 minutes before significant exertion to see if this helps your breathing and helps your functional capacity. Follow Dr. Bedelia Pong if needed for any changes in your breathing.

## 2024-03-12 NOTE — Progress Notes (Signed)
 Full PFT performed today.
